# Patient Record
Sex: Female | Born: 1995 | Race: Black or African American | Hispanic: No | Marital: Single | Smoking: Never smoker
Health system: Southern US, Community
[De-identification: ages and names within clinical notes are randomized; demographics above are authoritative.]

## PROBLEM LIST (undated history)

## (undated) DIAGNOSIS — K5904 Chronic idiopathic constipation: Secondary | ICD-10-CM

## (undated) DIAGNOSIS — K219 Gastro-esophageal reflux disease without esophagitis: Secondary | ICD-10-CM

## (undated) DIAGNOSIS — R7989 Other specified abnormal findings of blood chemistry: Secondary | ICD-10-CM

## (undated) DIAGNOSIS — K589 Irritable bowel syndrome without diarrhea: Secondary | ICD-10-CM

## (undated) HISTORY — DX: Gastro-esophageal reflux disease without esophagitis: K21.9

## (undated) HISTORY — DX: Other specified abnormal findings of blood chemistry: R79.89

## (undated) HISTORY — DX: Chronic idiopathic constipation: K59.04

## (undated) HISTORY — DX: Irritable bowel syndrome, unspecified: K58.9

## (undated) HISTORY — PX: TONSILLECTOMY: SHX28A

## (undated) HISTORY — PX: TONSILLECTOMY: SUR1361

---
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
## 2015-01-03 ENCOUNTER — Encounter: Payer: Self-pay | Admitting: Primary Care

## 2015-01-03 DIAGNOSIS — K589 Irritable bowel syndrome without diarrhea: Secondary | ICD-10-CM | POA: Insufficient documentation

## 2015-01-03 NOTE — Progress Notes (Signed)
Heather Callahan is a 19 y.o. female seen to establish care.     Chief Complaint   Patient presents with    New Patient Visit     college     Transferring care from pediatrician. Just completed first year of college with predictable weight gain. No other concerns.     The following fields were reviewed and updated in EMR:     Patient Active Problem List    Diagnosis Date Noted    Scoliosis 12/28/2014     Intermittent back pain with stressful activities.  Playing euphonium in HS triggered back pain.       Anemia 12/28/2014     Resolved 2014 now resolved. No longer on iron.       BMI 40.0-44.9, adult 12/28/2014     Weight gain with college now coming off again.  Always overweight.  Active bowler.  Highest score 230.       Maternal hypothyroidism 12/28/2014    History of elevated lipids 12/28/2014     10/30/12 167/58/41/126      Impacted tooth 07/26/2011     Seen by dentist. Extraction not recommended.          Past Medical History   Diagnosis Date    Functional constipation     Low TSH level     Tinea pedis     Vitamin D deficiency        Current Outpatient Prescriptions   Medication Sig Dispense Refill    polyethylene glycol (GLYCOLAX,MIRALAX) powder packet 17 g daily as needed       No current facility-administered medications for this visit.        Past Surgical History   Procedure Laterality Date    Tonsillectomy         family history includes Breast cancer in her paternal grandmother; Thyroid disease in her mother.     reports that she has never smoked. She does not have any smokeless tobacco history on file. She reports that she does not drink alcohol, use illicit drugs, or engage in sexual activity.  Social History     Social History Narrative    Lives with paternal grandmother since 12 months of age after parents divorced.  Good relationships with family. Four full siblings and seven half siblings. 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                 Review of Systems    Constitutional: Negative.    HENT: Negative.    Eyes: Negative.    Respiratory: Negative.    Cardiovascular: Negative.    Gastrointestinal: Positive for constipation.   Genitourinary: Negative.    Musculoskeletal: Negative.    Skin: Negative.    Neurological: Negative.    Endo/Heme/Allergies: Negative.    Psychiatric/Behavioral: Negative.      Episodic constipation alternating with loose stools.  Mild abdominal pain relieved with stooling.  Long standing issue.     Medications and history reviewed with patient in erecord today. Revisions, if any, noted below.     Vitals:    12/28/14 1418   BP: 110/72   Pulse: 90   Weight: 108.2 kg (238 lb 9.6 oz)   Height: 1.626 m ( )     Body mass index is 40.96 kg/(m^2).    General:  Patient in no apparent distress.  Pleasant and cooperative.   HEENT:  Atraumatic.  PERRLA. EOMI. Oropharynx mucosa pink, moist and free of lesions.  TM bilaterally  visualized with good light reflex.  Thyroid without mass or enlargement.  Neck supple.  Nodes cervical and supraclavicular wnl. Sinuses Non-tender to light percussion.  Breast:  Normal appearance.  No masses appreciated on palpation.  No nipple discharge. Instructed breast self-exam.  CV:  Regular rate and rhythm, no murmur, gallop or rub.  No LE edema.   Lung:  Clear to auscultation bilaterally. No adventitial sounds.  No dullness to percussion. No use of accessory muscles to breath.  Abdomen: Normal appearance. Nontender. Normoactive bowel sounds. No organomegaly appreciated. No guarding rebound.   MSK/EXT:  5/5 bilateral UE/LE proximal and distal strength.   BACK:  Normal appearance. Intact LS flexion. No pain on palpation paraspinal muscles.    NEURO: CN ii-xii intact.  Sensory intact. Heel to toe walking, F->N, H->S intact. Romberg normal.  DTR 2+ bilateral knee and ankle. Gait, heel walk, toe walk, squat intact. SLR negative.   Psych:  Mood: Calm. Affect: appropriate to content of speech Thought: Logical. No suicidal ideation or  wish to harm others. Speech: Fluent and articulate. Insight: good.  GU: Declined exam.   Rectum:  Declined exam.   Nodes:  Axillary and Inguinal wnl.   Pulse: Carotid, radial, DP, PT wnl.   Skin: No lesions.     Labs: Reviewed from old records and significant abnormal labs noted in problem list.     Assessment/Plan:    1. Irritable bowel syndrome:  Long standing symptoms and likely dx.  Miralax for use prn.      2. Scoliosis: Not appreciated on exam. No back pain.     3. Anemia: Menstruating.  Repeat lab. NB: with GI symptoms that celiac may need to r/u.   - CBC and differential; Future    4. BMI 40.0-44.9, adult    - Hemoglobin A1c; Future  - TSH; Future  - T4, free; Future    5. Maternal hypothyroidism, unspecified trimester/ Note on late entry old records show prior low TSH.  Repeat labs ordered.     6. History of elevated lipids  Last labs wnl.  Recheck 2019.     7. Impacted tooth  Asymptomatic.         Health Maintenance   Topic Date Due    IMM-TDAP/TD 737-18 YRS (1) Records show given 2008.  Please revise.     HIV TESTING OFFERED  Declined.     IMM-INFLUENZA (1) 03/04/2015    IMM-HPV 9-18 YRS  Completed    IMM-MCV4 0-18 YRS  Completed       Return if symptoms worsen or fail to improve.    New Prescriptions    No medications on file     Modified Medications    No medications on file     Discontinued Medications    No medications on file        Current Outpatient Prescriptions   Medication Sig Dispense Refill    polyethylene glycol (GLYCOLAX,MIRALAX) powder packet 17 g daily as needed       No current facility-administered medications for this visit.

## 2015-01-05 ENCOUNTER — Encounter: Payer: Self-pay | Admitting: Primary Care

## 2015-01-06 ENCOUNTER — Encounter: Payer: Self-pay | Admitting: Primary Care

## 2015-07-07 ENCOUNTER — Encounter: Payer: Self-pay | Admitting: Gastroenterology

## 2015-07-14 ENCOUNTER — Encounter: Payer: Self-pay | Admitting: Primary Care

## 2015-07-14 DIAGNOSIS — L700 Acne vulgaris: Secondary | ICD-10-CM | POA: Insufficient documentation

## 2015-07-19 ENCOUNTER — Ambulatory Visit: Payer: Self-pay

## 2015-08-17 ENCOUNTER — Telehealth: Payer: Self-pay | Admitting: Primary Care

## 2015-08-17 NOTE — Telephone Encounter (Signed)
Spoke with patient concerning SOB and heart palpitations. Patient c/o of heart palpitations and SOB patient denies chest pressure, lightheaded,pain or nausea. Patient would like to be seen tomorrow. Encouraged patient if symptoms worsen to go to ED or Urgent Care patient verbalized understanding

## 2015-08-17 NOTE — Telephone Encounter (Signed)
08/18/15 with Dr.jaffe.

## 2015-08-18 ENCOUNTER — Ambulatory Visit: Payer: Self-pay | Admitting: Primary Care

## 2015-08-18 ENCOUNTER — Encounter: Payer: Self-pay | Admitting: Emergency Medicine

## 2015-08-18 ENCOUNTER — Emergency Department
Admission: EM | Admit: 2015-08-18 | Disposition: A | Discharge status: Home / Self Care | Payer: Self-pay | Source: Ambulatory Visit | Attending: Emergency Medicine | Admitting: Emergency Medicine

## 2015-08-18 ENCOUNTER — Other Ambulatory Visit: Payer: Self-pay | Admitting: Gastroenterology

## 2015-08-18 ENCOUNTER — Encounter: Payer: Self-pay | Admitting: Primary Care

## 2015-08-18 VITALS — BP 118/70 | HR 108 | Ht 64.0 in | Wt 250.0 lb

## 2015-08-18 DIAGNOSIS — R0602 Shortness of breath: Secondary | ICD-10-CM

## 2015-08-18 LAB — BASIC METABOLIC PANEL
Anion Gap: 13 (ref 7–16)
CO2: 23 mmol/L (ref 20–28)
Calcium: 8.7 mg/dL — ABNORMAL LOW (ref 9.0–10.4)
Chloride: 104 mmol/L (ref 96–108)
Creatinine: 0.78 mg/dL (ref 0.50–1.00)
GFR,Black: 127 *
GFR,Caucasian: 110 *
Glucose: 97 mg/dL (ref 60–99)
Lab: 7 mg/dL (ref 6–20)
Potassium: 4.4 mmol/L (ref 3.3–5.1)
Sodium: 140 mmol/L (ref 133–145)

## 2015-08-18 LAB — CBC AND DIFFERENTIAL
Baso # K/uL: 0 10*3/uL (ref 0.0–0.1)
Basophil %: 0.1 %
Eos # K/uL: 0 10*3/uL (ref 0.0–0.4)
Eosinophil %: 0.6 %
Hematocrit: 19 % — CL (ref 34–45)
Hemoglobin: 5.7 g/dL — ABNORMAL LOW (ref 11.2–15.7)
IMM Granulocytes #: 0 10*3/uL (ref 0.0–0.1)
IMM Granulocytes: 0.4 %
Lymph # K/uL: 2.4 10*3/uL (ref 1.2–3.7)
Lymphocyte %: 34.8 %
MCH: 23 pg — ABNORMAL LOW (ref 26–32)
MCHC: 29 g/dL — ABNORMAL LOW (ref 32–36)
MCV: 78 fL — ABNORMAL LOW (ref 79–95)
Mono # K/uL: 0.7 10*3/uL (ref 0.2–0.9)
Monocyte %: 9.9 %
Neut # K/uL: 3.7 10*3/uL (ref 1.6–6.1)
Nucl RBC # K/uL: 0 10*3/uL (ref 0.0–0.0)
Nucl RBC %: 0 /100 WBC (ref 0.0–0.2)
Platelets: 324 10*3/uL (ref 160–370)
RBC: 2.5 MIL/uL — ABNORMAL LOW (ref 3.9–5.2)
RDW: 16.1 % — ABNORMAL HIGH (ref 11.7–14.4)
Seg Neut %: 54.2 %
WBC: 6.9 10*3/uL (ref 4.0–10.0)

## 2015-08-18 LAB — TIBC
Iron: 17 ug/dL — ABNORMAL LOW (ref 34–165)
TIBC: 356 ug/dL (ref 250–450)
Transferrin Saturation: 5 % — ABNORMAL LOW (ref 15–50)

## 2015-08-18 LAB — POCT URINE PREGNANCY
Exp date: 3
Lot #: 119119

## 2015-08-18 LAB — HM HIV SCREENING OFFERED

## 2015-08-18 LAB — FERRITIN: Ferritin: 7 ng/mL — ABNORMAL LOW (ref 10–120)

## 2015-08-18 LAB — TYPE AND SCREEN
ABO RH Blood Type: O POS
Antibody Screen: NEGATIVE

## 2015-08-18 LAB — HOLD BLUE

## 2015-08-18 MED ORDER — SODIUM CHLORIDE 0.9 % IV SOLN WRAPPED *I*
3.0000 mL/h | Status: DC
Start: 2015-08-18 — End: 2015-08-19

## 2015-08-18 MED ORDER — SODIUM CHLORIDE 0.9 % IV BOLUS *I*
1000.0000 mL | Freq: Once | Status: AC
Start: 2015-08-18 — End: 2015-08-18
  Administered 2015-08-18: 1000 mL via INTRAVENOUS

## 2015-08-18 NOTE — ED Provider Notes (Addendum)
History     Chief Complaint   Patient presents with    Hypertension     She reports that sometimes, there is a throbbing in her head as though she can feel her pulse in her head.     Shortness of Breath     Pt stated that she feels as though any time she walks or anything, she feels SOB. Denies cardiac issues. Pt noted to be HTN @ triage, 172/72.      HPI Comments: Heather Callahan is a 20 y.o. female who presents with shortness of breath.  Triggered by activity and exertion.  Ongoing for the last 4 days.  Intermittent episodes.  Associated with the sensation of a racing heart.    History reviewed. No pertinent past medical history.     Past Surgical History:   Procedure Laterality Date    TONSILLECTOMY       History reviewed. No pertinent family history.    Social History    reports that she has never smoked. She does not have any smokeless tobacco history on file. Her alcohol, drug, and sexual activity histories are not on file.    Living Situation     Questions Responses    Patient lives with     Homeless No    Caregiver for other family member     External Services     Employment     Domestic Violence Risk           Problem List   There is no problem list on file for this patient.      Review of Systems   Review of Systems   Constitutional: Negative for fever.   HENT: Negative for sore throat.    Eyes: Negative for visual disturbance.   Respiratory: Positive for shortness of breath. Negative for cough.    Cardiovascular: Positive for palpitations. Negative for chest pain.   Gastrointestinal: Negative for abdominal pain, nausea and vomiting.   Genitourinary: Negative for decreased urine volume and dysuria.   Musculoskeletal: Negative for gait problem.   Skin: Negative for rash.   Neurological: Negative for syncope, light-headedness and headaches.   Psychiatric/Behavioral: Negative for confusion.       Physical Exam     ED Triage Vitals   BP Pulse Heart Rate (via Pulse Ox) Resp Temp Temp src SpO2 O2  Device O2 Flow Rate   08/18/15 1732 -- 08/18/15 1732 08/18/15 1732 08/18/15 1732 08/18/15 1732 08/18/15 1732 08/18/15 1732 --   172/72  116 18 37 C (98.6 F) TEMPORAL 100 % None (Room air)       Weight           08/18/15 1732           113.4 kg (250 lb)                    Physical Exam   Constitutional: She is oriented to person, place, and time. She appears well-developed. No distress.   Sitting up in bedside chair, alert, comfortable, calm.   HENT:   Head: Normocephalic and atraumatic.   Right Ear: External ear normal.   Left Ear: External ear normal.   Nose: Nose normal.   Eyes: Conjunctivae are normal. Right eye exhibits no discharge. Left eye exhibits no discharge. No scleral icterus.   Neck: Normal range of motion. Neck supple. No JVD present.   Cardiovascular: Regular rhythm, normal heart sounds and intact distal pulses.  Tachycardia present.  Pulmonary/Chest: Effort normal and breath sounds normal. No respiratory distress. She has no wheezes. She has no rales.   Abdominal: She exhibits no distension.   Musculoskeletal: Normal range of motion. She exhibits no edema.   Neurological: She is alert and oriented to person, place, and time. She exhibits normal muscle tone.   No LE edema or calf tenderness. Denna Haggard' sign negative bilaterally.     Skin: Skin is warm and dry. No rash noted. She is not diaphoretic.   Psychiatric: She has a normal mood and affect. Her behavior is normal. Thought content normal.   Nursing note and vitals reviewed.      Medical Decision Making      Amount and/or Complexity of Data Reviewed  Clinical lab tests: ordered and reviewed  Tests in the radiology section of CPT: ordered and reviewed  Decide to obtain previous medical records or to obtain history from someone other than the patient: yes  Review and summarize past medical records: yes  Discuss the patient with other providers: yes  Independent visualization of images, tracings, or specimens: yes        Initial Evaluation:  ED  First Provider Contact     Date/Time Event User Comments    08/18/15 1741 ED Provider First Contact Tyriq Moragne Initial Face to Face Provider Contact          Patient seen by me as above    Assessment:  19 y.o.female comes to the ED with exertional dyspnea and palpitations. She is taking OCPs. She is tachycardic. These are signs that raise some suspicion for a possible PE. Will obtain CTA chest to evaluate.     This is not consistent with a PE. Pt has no chest pain, no hemoptysis, no h/o PE or DVT, no h/o cancer, no recent surgeries or hospitalizations, no recent flights or long trips. Pt has no dyspnea, no tachypnea, no hypoxia, no hypotension, no leg swelling or pain.     ==============   Update:     Labs reveal significant anemia, which is the most likely etiology for her exertional symptoms. Pt has signed written consent for blood transfusion. Two units ordered.     ==============      Differential Diagnosis includes cardiac dysrhythmia; electrolyte abnormality, anemia, dehydration; pregnancy.     Plan:  1. Diagnostics: ekg, cbc, bmp, pregnancy   2. Therapeutic: IVFs    Salem Senate, MD         Salem Senate, MD  08/18/15 Nida Boatman       Salem Senate, MD  08/19/15 1012

## 2015-08-18 NOTE — ED Triage Notes (Signed)
Triage Note       [place] L Frederich Montilla, RN

## 2015-08-18 NOTE — Progress Notes (Signed)
Subjective: Heather Callahan is a 20 y.o. female has Impacted tooth; Scoliosis; Anemia; BMI 40.0-44.9, adult; Maternal hypothyroidism; History of elevated lipids; Irritable bowel syndrome; and Acne vulgaris on her problem list.  Patient is seen for acute visit.   Chief Complaint   Patient presents with    Shortness of Breath     Young woman recently started on oral contraception pill now with several days of shortness of breath with walking.  She notices that her heartbeat is very fast.  Initially she thought this might be because she had started to drink a large caffeinated drink in the mornings.  She stopped doing that for several days.  Symptoms have persisted.  No recent illness.  No recent travel.  No trauma.  No lower extremity edema.  No chest pain.    Medications and history reviewed with patient in erecord today. Revisions, if any, noted below.     PE:    Vitals:    08/18/15 1432   BP: 118/70   Pulse: 108   Weight: 113.4 kg (250 lb)   Height: 1.626 m ( )     Body mass index is 42.91 kg/(m^2).    General:  Patient in no apparent distress.  Pleasant and cooperative.   CV: LAD murmur at the base.  Not previously noted.  Lung:  Clear to auscultation bilaterally. No adventitial sounds.  No dullness to percussion. No use of accessory muscles to breath.  Abdomen: Central obesity unchanged.  Nontender. Normoactive bowel sounds. No organomegaly appreciated. No guarding rebound.       Labs:    Documentation Only on 01/06/2015   Component Date Value Ref Range Status    HM HIV SCREENING OFFERED 12/28/2014 Declined   Final       Assessment/Plan:    1. Shortness of breath:  Patient is directed to the ED because of new onset dyspnea on exertion, tachycardia, new murmur not previously appreciated.  Concern for PE, concern for new pericardial effusion.  She is given a note to excuse her from exam that she has this afternoon.  Her plan is to go directly to the [facility] ED as suggested.  Case reviewed with Dr. Lynelle Smoke  in ED and plan is the get CT Chest, labs and consider echo.     Return if symptoms worsen or fail to improve.     New Prescriptions    No medications on file     Modified Medications    No medications on file     Discontinued Medications    No medications on file        Current Outpatient Prescriptions   Medication Sig Dispense Refill    LORYNA 3-0.02 MG per tablet 1 BY MOUTH EVERY DAY  2    polyethylene glycol (GLYCOLAX,MIRALAX) powder packet 17 g daily as needed       No current facility-administered medications for this visit.

## 2015-08-19 LAB — TRANSFERRIN: Transferrin: 284 mg/dL (ref 200–360)

## 2015-08-19 NOTE — ED Provider Progress Notes (Signed)
ED Provider Progress Note    Critical Care    There is a high probability of imminent or life threatening deterioration due to  circulatory failure    This is secondary to acute anemia    Acute interventions include blood product transfusion, development of treatment plan with patient or surrogate, obtaining history from patient or surrogate, examination of patient, ordering and performing treatments and interventions, ordering and review of laboratory studies, ordering and review of radiographic studies, evaluation of the patient's response to treatment, review of old charts and discussion with primary provider    Exact time of critical care (exclusive of other billable procedures)  39 minutes          Salem Senate, MD, 08/19/2015, 4:37 PM         Salem Senate, MD  08/19/15 [phone]

## 2015-08-19 NOTE — Discharge Instructions (Signed)
You were given 2 units of blood. Your blood count will still be low. Work with your doctor to bring it up more.   See attached instructions regarding side effects of transfusion and what to watch for.  Please return to the ED with any problems.

## 2015-08-20 LAB — EKG 12-LEAD
P: 23 degrees
QRS: 43 degrees
Rate: 104 {beats}/min
Severity: BORDERLINE
Severity: BORDERLINE
Statement: BORDERLINE
Statement: NORMAL
T: -6 degrees

## 2015-08-20 LAB — RED BLOOD CELLS
Coded Blood type: 5100
Coded Blood type: 5100
Component blood type: O POS
Component blood type: O POS
Dispense status: TRANSFUSED
Dispense status: TRANSFUSED

## 2015-08-21 ENCOUNTER — Telehealth: Payer: Self-pay | Admitting: Primary Care

## 2015-08-21 NOTE — Telephone Encounter (Signed)
Patient was seen in emergency room for heavy menstrual bleeding, was given transfusions.  Her mother called today to see what supplement for iron would be appropriate.  Advised ferrous sulfate 325 mg once daily.  She will follow up with gynecology this upcoming week.

## 2015-11-04 ENCOUNTER — Ambulatory Visit: Payer: Self-pay | Admitting: Internal Medicine

## 2015-12-14 ENCOUNTER — Ambulatory Visit: Payer: Self-pay | Admitting: Internal Medicine

## 2015-12-14 ENCOUNTER — Encounter: Payer: Self-pay | Admitting: Internal Medicine

## 2015-12-14 VITALS — BP 112/72 | Temp 98.1°F | Ht 64.0 in | Wt 259.0 lb

## 2015-12-14 DIAGNOSIS — K047 Periapical abscess without sinus: Secondary | ICD-10-CM

## 2015-12-14 MED ORDER — PENICILLIN V POTASSIUM 500 MG PO TABS *I*
500.0000 mg | ORAL_TABLET | Freq: Four times a day (QID) | ORAL | 0 refills | Status: DC
Start: 2015-12-14 — End: 2018-09-30

## 2015-12-15 ENCOUNTER — Ambulatory Visit: Payer: Self-pay | Admitting: Oral and Maxillofacial Surgery

## 2015-12-15 NOTE — Progress Notes (Signed)
HPI Heather Callahan is a 20 year old patient with history of irritable bowel syndrome, scoliosis, anemia, who presents today with a three-day history of pain left maxillary area and upper jaw.  She does have a history of impacted wisdom tooth, and has noted foul taste in her mouth associated with that.    ROS HEENT patient does have maxillary sinus tenderness, left jaw pain, and heaviness around her eye.  She denies any photophobia or eye pain.  Respiratory she denies any hoarseness or cough, cardiovascular patient denies any palpitations, syncope or shortness of breath.    Physical exam BP is 112/72, temperature is 36.7, pain is 4 out of 10 in her left upper jaw.  HEENT PERRLA, EOMs are intact, conjunctiva are clear.  Ears and nose are clear, throat is clear, she does have mild erythema and tenderness behind last upper molar on the left.  Tender to palpation.  She does have some maxillary sinus tenderness on exam on the left.  She does have anterior cervical lymphadenopathy on the left, lungs are clear throughout.  Heart revealed normal S1 and S2 without murmur, gallop or rub.    Assessment and plan abscess wisdom tooth will begin Pen-Vee K 500 mg 4 times daily for 10 days, gargle with salt water and especially after meals, follow-up with dentist as needed.

## 2015-12-16 ENCOUNTER — Ambulatory Visit: Payer: Self-pay | Admitting: Oral and Maxillofacial Surgery

## 2015-12-16 ENCOUNTER — Encounter: Payer: Self-pay | Admitting: Oral and Maxillofacial Surgery

## 2015-12-16 VITALS — BP 137/86 | HR 79 | Resp 17 | Ht 65.0 in | Wt 258.0 lb

## 2015-12-16 DIAGNOSIS — K011 Impacted teeth: Secondary | ICD-10-CM

## 2015-12-16 NOTE — H&P (Signed)
Oral and Maxillofacial Surgery  Consult Note      Chief Complaint   Patient presents with    Dental Problem     third molars       Past Medical History:   Diagnosis Date    Functional constipation     GERD (gastroesophageal reflux disease)     IBS (irritable bowel syndrome)     Low TSH level     Tinea pedis     Vitamin D deficiency        Past Surgical History:   Procedure Laterality Date    TONSILLECTOMY      TONSILLECTOMY         Prior to Admission medications    Medication Sig Start Date End Date Taking? Authorizing Provider   penicillin v potassium (VEETIDS) 500 MG tablet Take 1 tablet (500 mg total) by mouth 4 times daily 12/14/15   Caprice RenshawHall, Catherine A, PA   LORYNA 3-0.02 MG per tablet 1 BY MOUTH EVERY DAY 08/07/15   [provider]   polyethylene glycol (GLYCOLAX,MIRALAX) powder packet 17 g daily as needed    [provider]          Current Outpatient Prescriptions   Medication    penicillin v potassium (VEETIDS) 500 MG tablet    LORYNA 3-0.02 MG per tablet    polyethylene glycol (GLYCOLAX,MIRALAX) powder packet     No current facility-administered medications for this visit.           Allergies:  Review of patient's allergies indicates no known allergies (drug, envir, food or latex).    Social:  reports that she has never smoked. She has never used smokeless tobacco. She reports that she does not drink alcohol or use illicit drugs.     Family Hx:  Family History   Problem Relation Age of Onset    Thyroid disease Mother     Breast cancer Paternal Grandmother        VS: Blood pressure 137/86, pulse 79, resp. rate 17, height 1.651 m (5\' 5" ), weight 117 kg (258 lb), last menstrual period 10/05/2015, SpO2 99 %.    [facility]    Problem/Concerns: Impacted/malpositioned teeth # [id number],16,17,32    Subjective:  20yo F w/h/o obesity and PCOS presents for evaluation of her third molars for extraction. She reports isolated pain to tooth #16 a few days ago that was causing a  HA. Her HA was so severe that she vomited. Otherwise no h/o facial edema.    Pt reports 0/10 pain    Pt denies any fever, chills, or other s/s of systemic infection    Objective:              General:  AAOx3                   ASA: 2    Extraoral Exam: No LAD, No TTP, TMJ WNL, no facial asymmetries noted.     Intraoral Exam: FOM soft and nonelevated, OP clear and uvula at midline, good OH,   MIO 50mm. No edema, erythema, purulence, or other signs of acute infection present. Teeth # 1,16 are partially erupted into the oral cavity. Teeth # 17,32 are fully into the oral cavity at this time. Tooth #32 is unopposed and non-functional.    Mallampati: 1  Thyromental distance: 4 fw    Images: Current Radiographs in [facility] obtained 6/13  Findings:     TMJ: No obvious pathology, No hard  tissue pathology           Teeth position / impaction: Teeth #1,16 fully developed. Teeth #17,32 fully developed and erupted. Root darkening tooth #32.      Assessment: 20 y.o. female w/h/o obesity, PCOS presents for evaluation of teeth # [id number] for extraction. Medical hx reviewed with pt and updated in chart. Teeth # [id number] are malpositioned/impacted and may pose problems to other teeth and overall periodontal health, and are indicated for extraction. D/t dental anxiety, pt would prefer to have procedure completed under IV sedation.    At least 30 mins of time spent with patient during this visit    Case discussed with attending covering the OMFS clinic on this date    Informed Consent:   I fully discussed the rationale, technical details, risks/benefits, and alternatives of surgery and anesthesia including no treatment as an option.    Preoperative and postoperative expectations were reviewed.   I reviewed with patient the risks and alternatives of Local, N2O, IV Sedation, and General Anesthesia.  I fully discussed the following potential complications: Pain, Bleeding, Swelling, Bruising, Infection, Numbness, and Damage  to Adjacent Structures as well as attempts to minimize these risks.  Questions were encouraged and answered to the patient's apparent satisfaction.   The patient was given preoperative information.    Surgical Plan:   1) Extraction of teeth # 1,61,09,60  2) Anesthesia: IV  3) Time required: 60 mins  4) Resident level: R2-4     All questions answered and pt left clinic in good condition.    This case was discussed with Dr. Fredderick Erb, DMD  OMFS Resident

## 2015-12-16 NOTE — Patient Instructions (Signed)
ORAL & MAXILLOFACIAL SURGERY    Pre-procedure Instructions for Intravenous (IV) Sedation and   information about wisdom tooth extraction    IV sedation is administered to relax you and to decrease any pain you might have.  It is always used with and injection of local anesthesia or “novocaine” at the site of the surgery.  You will not be unconscious.  You will feel drowsy, you may even sleep.  You may or may not remember the procedure.    It is IMPORTANT to follow these instructions so that your procedure can be done properly and safely.  Failure to follow these instructions may lead to cancellation and your appointment will need to be re-scheduled.    1. DO NOT EAT OR DRINK ANYTHING AFTER MIDNIGHT the night before the procedure, including water.  If you take prescription medication(s) for a heart condition, high blood pressure, infection or other illness, take the medication as scheduled with a small sip of water.    2. YOU MUST BE ACCOMPANIED BY A RESPONSIBLE ADULT WHO WILL HELP YOU GET HOME.  This person must wait in the waiting area during the entire time of the procedure.  You will not be able to leave the clinic alone, and you are not allowed to drive or operate heavy machinery for 24 hours after your sedation.  If you fail to have an escort with you in the waiting room at the scheduled time of your procedure, your appointment will be cancelled.      3. Wear a loose fitting short sleeve shirt.  Do not wear make-up, earrings, tongue or lip rings, nail polish or loose fitting shoes like flip-flops.    4. Persons under 18 years old must be accompanied by a parent or legal guardian.    If you are more than 15 minutes late or your ride is not physically with you when you arrive, your appointment will be cancelled (at discretion of the provider).    Every effort will be made to ensure that your surgery will begin at the scheduled time; however, there are occasions when a procedure may take longer than anticipated  and your procedure may be delayed.    WE WILL CANCEL YOUR SURGERY IF:  1. YOU HAD ANYTHING TO EAT OR DRINK SINCE MIDNIGHT  2. YOU HAVE NOT TAKEN MEDICATIONS AS DIRECTED  3. YOU HAVE NO RESPONSIBLE ADULT WHO CAN STAY WITH YOU AND TAKE YOU HOME  4. YOU ARRIVE MORE THAN 15 MINUTES LATE (at the discretion of the provider)        Wisdom teeth extraction  Wisdom teeth, or third molars, do not always erupt properly when they decide to make an appearance. It's wise to get an early opinion from your dentist on getting wisdom teeth pulled before they become impacted, causing pain, swelling, infection, cavities or gum disease.    Why don't wisdom teeth grow in right??The shape of the modern human mouth is often too small to accommodate wisdom teeth which make their first appearance in young adults between the ages of 15 to 25. Over the course of time, humans learned to harness fire for cooking foods and developed blade tools to better process food before consumption, they reduced the need for strong jaws to chew food.  According to studies of ancient skull specimens, over time, a full set of teeth in a smaller jaw caused crowding in permanent teeth because of lack of space.    What does impacted mean?? When wisdom teeth   don't have room to grow or they haven't reached their final position by age 25, they are considered impacted-no place to go and no plans to grow. Third molar impaction is the most prevalent medical developmental disorder. A full set of healthy teeth sometimes doesn't leave much room for wisdom teeth to erupt.    What kind of problems can impacted third molars cause?? Partially erupted wisdom teeth are breeding grounds for bacteria and germs that may cause infection, and cysts and tumors may grow on a trapped wisdom tooth. Jaw pain and gum disease may occur. Not all wisdom teeth cause problems, however.    What if I don't have any symptoms? People with symptoms of impaction, such as pain, swelling and infection  should have their wisdom teeth removed immediately. However, those with no symptoms can avoid the chance of ever suffering from the pain of impacted wisdom teeth or achieve better orthodontic treatment results by having them removed. Asymptomatic impacted wisdom teeth also should be removed to reduce the chance of unexplained pain, accommodate prosthetic appliances, or avoid cavities, periodontal disease, bone shrinkage and tumor development.    Why can't I just use an antibiotic?? Antibiotics only soothe infected wisdom teeth for a short time. Since people frequently use a wide variety of antibiotics, the infection may be resistant to such medication and doesn't solve the real problem: The tooth can't fit in your mouth.    When is removal necessary?? It isn't wise to wait until wisdom teeth bother you. Early removal, as advised by your dentist, is generally recommended to avoid problems, such as an impacted tooth that destroys the second molar. People younger than 16 heal easier too. At an early age, people should be evaluated by their dentist who can track third molar development with the help of X-rays. Second molars should be visible to lessen the chance of damaging them during surgery. This occurs at age 11 or 12, so wisdom teeth should be removed when the decision has been made that they cannot erupt into an acceptable position.    How is the tooth removed?? Surgery for impacted wisdom teeth consists of removing of the gum tissue over the tooth, gently stripping connective tissue away from the tooth and bone, removing the tooth and sewing the gum back up.    Recommended Website:  www.[facility]org            ORAL & MAXILLOFACIAL SURGERY    POSTOPERATIVE INSTRUCTIONS FOLLOWING DENTAL SURGERY    DO NOT drive, operate heavy machinery, drink alcoholic beverages, make major decisions, or sign important documents while taking narcotic pain medications.     1.  First 30 minutes after procedure:    Bite on gauze for 30  minutes after your procedure, to help stop bleeding.  It is best that you do this without checking the site until the 30 minutes is up. Do not chew on the gauze, as continuous pressure is preferred.  During this time, do not speak or do other activities that could dislodge the gauze packs.  If bleeding continues after 30 minutes, bite on fresh gauze as follows:  dampen the gauze first so that it is moist (not wet) and fold it twice.  Place the dampened gauze on the extraction sites for another 30 minutes.  Repeat as necessary.  A small amount of blood, which tinges the saliva red, is expected during the first 24 hours and does not require further pressure.  If bleeding restarts after it has stopped, use   gauze pressure again or consider gently biting on a dry tea bag placed over the extraction site for 30 minutes.  If the bleeding does not stop despite repeated attempts as above, please call [phone].      2.  Avoid excessive exertion for 72 hours after your procedure.  No bending, lifting, strenuous activity,sports or gym-class.  After this time, resume normal activity gradually as you become comfortable.      3.  Mouth opening may be limited during the first week to ten days after surgery.  This is expected due to swelling and tightness of the jaw muscles.     4.  Bruising (black and blue areas) may develop after surgery.  Should this occur, expect bruising to resolve after the first week to ten days.     5.   Earache or TMJ (jaw joint) tenderness may develop that will gradually resolve over the first week.     6.   NO ORAL CARE during the first 24 hours.  Beginning on the day after surgery, rinse your mouth after meals (or 4-5 times per day) with warm salt water (1/2 teaspoon of salt in an 8oz. glass of warm water).  A prescription mouth rinse may be given in selected cases. Begin tooth brushing on the day after surgery, as you normally would however this should be performed gently in the area of extractions.      7.   DO NOT USE STRAWS for the first week after surgery.  Straws create suction pressure inside your mouth and could dislodge the blood clots that normally form in the extraction sites.      8.   DO NOT SMOKE during the first 21 days after surgery.  Smoking greatly increases the risk of  and other healing complications including infection, pain, and dry socket.            9.   SWELLING is normal and will maximize 48-72 hours after surgery.  Apply ice in a damp towel against your cheeks for the first 24-48 hours, as follows: 20 minutes on 40 minutes off, then repeat. Keep your head elevated with 2 or 3 pillows when lying down during the first 24 hours.  This will reduce bleeding, swelling and improve your level of comfort.  May apply VaselineR or ChapstickR to the lips to keep them moist.      10.    Mild to moderate pain may be encountered after the local anesthetic wears off. If allowed by your health history, take two Extra-Strength TylenolR (500mg ea.) or two MotrinR/AdvilR/Ibuprofen (200mg ea).  Avoid aspirin-containing products since these may lead to more bleeding.  For moderate to severe pain, take the prescription medication as prescribed for you.  Do not operate heavy machinery or make important decisions while on narcotics.          11.  In case of uncontrolled bleeding, extreme pain or unusual circumstances, please call the office.     POSTOPERATIVE DIET SUGGESTIONS    1.  Avoid eating or drinking anything within the first hour after surgery.  After this time, drink lots of liquids and eat soft, cool foods.  Avoid excessively hot liquids and foods for the first 24 hours.             2.  During the first week, avoid hard, crispy foods such as chips, hard pretzels, and popcorn.            3.   The following   food choices are recommended (“liquid foods on the day of the procedure, soft “mushy” foods during the first week advancing to regular food as tolerated”):      a. During the first few days,  blenderized foods, soup broth, well-cooked pasta, eggs, yogurt, cottage cheese, Jell-O, pudding, custard, applesauce, bananas and ice cream.       b. After the first few days, gradually increase your diet to include: fish, chicken, meatloaf, mashed potatoes, cooked vegetables, soups and cereals, etc…     SINUS PRECAUTIONS (if indicated)  1.  Keep your head elevated during sleep with two to three pillows.  2.  Do NOT blow your nose!  3.  If you sneeze do not sneeze through the nose and do not “hold back.”  You may sneeze with your mouth open.    4.  Expect slight nose bleeds.  Call the office for heavy bleeding from the nose that you are unable to control with pressure.   5.  Do NOT do anything that requires a sucking-in or blowing-out motion such as smoking, use of straws or blowing up balloons.   6.  Take all prescribed and/or recommended medications as directed.

## 2015-12-23 ENCOUNTER — Telehealth: Payer: Self-pay

## 2015-12-23 NOTE — Telephone Encounter (Signed)
Message left for patient stating appointment time and reminder.  Instructions of nothing to eat or drink 8 hours prior to sedation given and need for responsible adult to bring pt to clinic, wait during procedure and drive home.   Instructions also given to arrive 30 minutes prior to appointment time for procedure preparation and that if pt is 15 minutes late, procedure may be cancelled. Clinic number left for concerns or questions.

## 2015-12-24 ENCOUNTER — Ambulatory Visit: Payer: Self-pay | Admitting: Oral and Maxillofacial Surgery

## 2016-02-11 ENCOUNTER — Encounter: Payer: Self-pay | Admitting: Oral and Maxillofacial Surgery

## 2016-02-11 ENCOUNTER — Ambulatory Visit: Payer: Self-pay | Admitting: Oral and Maxillofacial Surgery

## 2016-02-11 VITALS — BP 119/75 | HR 90 | Resp 18 | Ht 65.0 in | Wt 256.0 lb

## 2016-02-11 DIAGNOSIS — K011 Impacted teeth: Secondary | ICD-10-CM

## 2016-02-11 MED ORDER — MIDAZOLAM HCL 5 MG/5ML IJ SOLN *I*
INTRAMUSCULAR | Status: AC
Start: 2016-02-11 — End: 2016-02-11
  Administered 2016-02-11: 5 mg via INTRAVENOUS
  Filled 2016-02-11: qty 5

## 2016-02-11 MED ORDER — FENTANYL CITRATE 50 MCG/ML IJ SOLN *WRAPPED*
INTRAMUSCULAR | Status: AC
Start: 2016-02-11 — End: 2016-02-11
  Administered 2016-02-11: 100 ug via INTRAVENOUS
  Filled 2016-02-11: qty 2

## 2016-02-11 MED ORDER — DEXAMETHASONE SODIUM PHOSPHATE 4 MG/ML INJ SOLN *WRAPPED*
8.0000 mg | Freq: Once | INTRAMUSCULAR | Status: AC
Start: 2016-02-11 — End: 2016-02-11

## 2016-02-11 MED ORDER — DEXAMETHASONE SODIUM PHOSPHATE 4 MG/ML INJ SOLN *WRAPPED*
INTRAMUSCULAR | Status: AC
Start: 2016-02-11 — End: 2016-02-11
  Administered 2016-02-11: 8 mg via INTRAVENOUS
  Filled 2016-02-11: qty 2

## 2016-02-11 MED ORDER — PROPOFOL 10 MG/ML IV EMUL (INTERMITTENT DOSING) WRAPPED *I*
60.0000 mg | Freq: Once | INTRAVENOUS | Status: AC
Start: 2016-02-11 — End: 2016-02-11

## 2016-02-11 MED ORDER — KETAMINE HCL 10 MG/ML IJ/IV SOLN *WRAPPED*
Status: AC
Start: 2016-02-11 — End: 2016-02-11
  Administered 2016-02-11: 15 mg via INTRAVENOUS
  Filled 2016-02-11: qty 20

## 2016-02-11 MED ORDER — MIDAZOLAM HCL 5 MG/5ML IJ SOLN *I*
5.0000 mg | Freq: Once | INTRAMUSCULAR | Status: AC
Start: 2016-02-11 — End: 2016-02-11

## 2016-02-11 MED ORDER — IBUPROFEN 600 MG PO TABS *I*
600.0000 mg | ORAL_TABLET | Freq: Three times a day (TID) | ORAL | 0 refills | Status: AC | PRN
Start: 2016-02-11 — End: ?

## 2016-02-11 MED ORDER — SODIUM CHLORIDE 0.9 % IV SOLN WRAPPED *I*
125.0000 mL/h | Status: AC
Start: 2016-02-11 — End: 2016-04-11
  Administered 2016-02-11: 125 mL/h via INTRAVENOUS

## 2016-02-11 MED ORDER — KETAMINE HCL 10 MG/ML IJ/IV SOLN *WRAPPED*
15.0000 mg | Freq: Once | Status: AC
Start: 2016-02-11 — End: 2016-02-11

## 2016-02-11 MED ORDER — PROPOFOL 10 MG/ML IV EMUL (INTERMITTENT DOSING) WRAPPED *I*
INTRAVENOUS | Status: AC
Start: 2016-02-11 — End: 2016-02-11
  Administered 2016-02-11: 60 mg via INTRAVENOUS
  Filled 2016-02-11: qty 20

## 2016-02-11 MED ORDER — HYDROCODONE-ACETAMINOPHEN 5-325 MG PO TABS *I*
1.0000 | ORAL_TABLET | Freq: Four times a day (QID) | ORAL | 0 refills | Status: DC | PRN
Start: 2016-02-11 — End: 2018-09-30

## 2016-02-11 MED ORDER — FENTANYL CITRATE 50 MCG/ML IJ SOLN *WRAPPED*
100.0000 ug | Freq: Once | INTRAMUSCULAR | Status: AC
Start: 2016-02-11 — End: 2016-02-11

## 2016-02-11 NOTE — Preop H&P (Signed)
AMBULATORY  Chief Complaint:   Chief Complaint   Patient presents with    Procedure     Extraction teeth #1, 16, 17, 32 with IV sedation       History of Present Illness:  HPI Comments: 20yo female with h/o obesity, GERD, and PCOS presents for extraction of teeth #1, 16, 17, 32 with IV sedation      Past Medical History:   Diagnosis Date    Functional constipation     GERD (gastroesophageal reflux disease)     IBS (irritable bowel syndrome)     Low TSH level     Tinea pedis     Vitamin D deficiency      Past Surgical History:   Procedure Laterality Date    TONSILLECTOMY      TONSILLECTOMY       Family History   Problem Relation Age of Onset    Thyroid disease Mother     Breast cancer Paternal Grandmother      Social History     Social History    Marital status: Single     Spouse name: N/A    Number of children: N/A    Years of education: N/A     Social History Main Topics    Smoking status: Never Smoker    Smokeless tobacco: Never Used    Alcohol use No    Drug use: No    Sexual activity: No     Other Topics Concern    None     Social History Narrative    ** Merged History Encounter **         Lives with paternal grandmother since 262 months of age after parents divorced.  Good relationships with family. Four full siblings and seven half siblings. 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             Allergies:   No Known Allergies (drug, envir, food or latex)    Medications:  Current Outpatient Prescriptions   Medication    LORYNA 3-0.02 MG per tablet    polyethylene glycol (GLYCOLAX,MIRALAX) powder packet    HYDROcodone-acetaminophen (NORCO) 5-325 MG per tablet    ibuprofen (ADVIL,MOTRIN) 600 MG tablet    penicillin v potassium (VEETIDS) 500 MG tablet     Current Facility-Administered Medications   Medication Dose Route Frequency    sodium chloride 0.9 % IV  125 mL/hr Intravenous Continuous    midazolam (VERSED) 1 mg/mL injection        propofol (DIPRIVAN) 10 MG/ML injection         ketamine (KETALAR) 10 MG/ML injection 15 mg  15 mg Intravenous Once    midazolam (VERSED) injection 5 mg  5 mg Intravenous Once    propofol (DIPRIVAN) injection 60 mg  60 mg Intravenous Once        Review of Systems:   Review of Systems   Constitutional: Negative.  Negative for chills and fever.   HENT: Negative for hearing loss.         Impacted 3rd molars   Eyes: Negative.  Negative for blurred vision and double vision.   Respiratory: Negative.  Negative for shortness of breath and wheezing.    Cardiovascular: Negative.  Negative for chest pain and palpitations.   Gastrointestinal: Negative.  Negative for nausea and vomiting.   Genitourinary: Negative.    Musculoskeletal: Negative.    Skin: Negative.    Neurological: Negative.  Negative for seizures.   Endo/Heme/Allergies: Negative.  Does not bruise/bleed easily.   Psychiatric/Behavioral: Negative for depression. The patient is nervous/anxious.        Blood pressure 152/76, pulse 110, resp. rate 12, height 1.651 m ( ), weight 116.1 kg (256 lb), SpO2 100 %.    Physical Exam   Constitutional: She is oriented to person, place, and time. She appears well-developed and well-nourished.   HENT:   Head: Normocephalic and atraumatic.   Right Ear: External ear normal.   Left Ear: External ear normal.   Mouth/Throat: Oropharynx is clear and moist.   Impacted 3rd molars   Eyes: Pupils are equal, round, and reactive to light.   Neck: Normal range of motion.   Cardiovascular: Normal rate, regular rhythm and normal heart sounds.    No murmur heard.  Pulmonary/Chest: Effort normal and breath sounds normal. She has no wheezes.   Abdominal: Soft. Bowel sounds are normal.   Obese   Musculoskeletal: Normal range of motion.   Neurological: She is alert and oriented to person, place, and time.   Skin: Skin is warm and dry.   Psychiatric: She has a normal mood and affect. Her behavior is normal. Judgment and thought content normal.   Vitals reviewed.      Lab Results:  none    Radiology impressions (last 30 days):  No results found.    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 ASA: II, Mallampati: II, NPO: since midnight    Plan: Extraction teeth #1, 16, 17, 32 with IV sedation    Author: Olene Floss, DDS  Note created: 02/11/2016  at: 2:09 PM

## 2016-02-11 NOTE — Invasive Procedure Plan of Care (Signed)
Invasive Procedure Plan of Care (Consent Form 419):   Condition(s) Addressed: Impacted teeth, Dental Decay   Person Performing Procedure: Olene Floss, and OMFS Residents: Artis Delay, Samara Deist, Rush Barer, Audel Coakley, Irine Seal, Theophilus Kinds, Erlene Quan, Denese Killings Standing, Milly Jakob  OMFS Attendings: Rocky Crafts, Lenon Ahmadi, Glorianne Manchester, Trisha Mangle, Mellody Drown, Richard Randa Evens, Thom Chimes, Nicholas Schellati, Minette Headland     Side: Bilateral    Procedure: - Extraction of Tooth #'s 1, 16, 17, 32    Special Equipment:    Planned Anesthesia:    Benefits: Removal of source of pain and infection. Improved oral hygiene.   Risks: Pain, bleeding, swelling, infection, dry socket, bony spicule, damage to adjacent teeth or structures, retained or displaced tooth fragments, sinus communication, TMJ pain, trismus, nerve injury with temporary or permanent numbness to chin, lip and/or tongue with/without loss of some taste, mandibular and/or tuberosity fracture. Risks of anesthesia.     Alternatives: Continued observation. No treatment.   Expected Length of Stay: 0 day(s)   Pt Decision: Agrees to proceed     ----------------------------------------------------------------------------------------------------------------------------------------  Consent:  I hereby give my consent and authorize Olene Floss, and OMFS Residents: Artis Delay, Samara Deist, Rush Barer, Rozelle Caudle, Irine Seal, Theophilus Kinds, Erlene Quan, Denese Killings Standing, Milly Jakob  OMFS Attendings: Rocky Crafts, Lenon Ahmadi, Glorianne Manchester, Trisha Mangle, Mellody Drown, Richard Randa Evens, Thom Chimes, Nicholas Schellati, Minette Headland    (The list of possible assistants, all of whom are privileged to provide surgical services at the hospital, is available)  To treat the following: Impacted teeth, Dental Decay  Procedure includes: - Extraction of Tooth #'s 1, 16, 17, 32   1  The care provider has explained my condition to me, the benefits of having the above treatment procedure, and alternate ways of treating my condition. I understand that no guarantees have been made to me about the result of the treatment. The alternatives to this procedure include: Continued observation. No treatment.   2 The care provider has discussed with me the reasonably foreseeable risks of the treatment and that there may be undesirable results. The risks that are specifically related to this procedure include: Pain, bleeding, swelling, infection, dry socket, bony spicule, damage to adjacent teeth or structures, retained or displaced tooth fragments, sinus communication, TMJ pain, trismus, nerve injury with temporary or permanent numbness to chin, lip and/or tongue with/without loss of some taste, mandibular and/or tuberosity fracture. Risks of anesthesia.     3 I understand that during the treatment a condition may be discovered which was not known before the treatment started. Therefore, I authorize the care provider to perform any additional or different treatment which is thought necessary and available.   4 Any tissue, parts, or substances removed during the procedure may be retained or disposed of in accordance with customary scientific, educational and clinical practice.   5 Vendor information if appropriate:      6 Patient Consent for Medical or Surgical Procedure: I have carefully read and fully understand this informed consent form, and have had sufficient opportunity to discuss my condition and the above procedure(s) with the care provider and his/her associates, and all of my questions have been answered to my satisfaction.    Agrees to proceed       ____________________________________________________   _______________   Signature of Patient  Date/Time     ____________________________________________________   _______________   Signature of Parent or Legal Guardian  (if Patent is  unable to sign  or is a minor) Date/Time       Complete this section for all OR procedures and all other invasive internal procedures performed in any setting.  7 Consent for Receipt of Tissue(s): Not applicable   8 For those procedures that have the potential for significant blood loss: not applicable.   9 Patient Consent for Blood/Tissue: Not applicable.    Patient sign here unless 7 and 8 do not apply.       ____________________________________________________   _______________   Signature of Patient Date/Time     ____________________________________________________   _______________   Signature of Parent or Legal Guardian  (if Patent is unable to sign or is a minor) Date/Time      I have discussed the planned procedure, including the potential for any transfusion of blood products or receipt of tissue as necessary, expected benefits, the potential complications and risks and possible alternatives and their benefits and risks with the patient or the patient's surrogate. In my opinion, the patent or the patient's surrogate understands the proposed procedure, its risks, benefits, and alternatives.    Electronically signed by Olene Flossharles Arcadio Cope, DDS at 1:22 PM

## 2016-02-11 NOTE — Patient Instructions (Signed)
ORAL & MAXILLOFACIAL SURGERY    POSTOPERATIVE INSTRUCTIONS FOLLOWING DENTAL SURGERY AND SEDATION    After receiving sedative medications you may be drowsy for up to 24hrs.  DO NOT drive or operate heavy machinery for 24hrs, DO NOT drink alcoholic beverages for 24hrs, DO NOT make major decisions, sign contracts, etc. for 24hrs.    DO NOT drive, operate heavy machinery, drink alcoholic beverages or make major decisions or sign important documents while taking narcotic pain medications.     1.  First 30 minutes after procedure:    Bite on gauze for 30 minutes after your procedure, to help stop bleeding.  It is best that you do this without checking the site until the 30 minutes is up. Do not chew on the gauze, as continuous pressure is preferred.  During this time, do not speak or do other activities that could dislodge the gauze packs.  If bleeding continues after 30 minutes, bite on fresh gauze as follows:  dampen the gauze first so that it is moist (not wet) and fold it twice.  Place the dampened gauze on the extraction sites for another 30 minutes.  Repeat as necessary.  A small amount of blood, which tinges the saliva red, is expected during the first 24 hours and does not require further pressure.  If bleeding restarts after it has stopped, use gauze pressure again or consider gently biting on a dry tea bag placed over the extraction site for 30 minutes.  If the bleeding does not stop despite repeated attempts as above, please call [phone].      2.  Avoid excessive exertion for 72 hours after your procedure.  No bending, lifting, strenuous activity,sports or gym-class.  After this time, resume normal activity gradually as you become comfortable.      3.  Mouth opening may be limited during the first week to ten days after surgery.  This is expected due to swelling and tightness of the jaw muscles.     4.  Bruising (black and blue areas) may develop after surgery.  Should this occur, expect bruising to  resolve after the first week to ten days.     5.   Earache or TMJ (jaw joint) tenderness may develop that will gradually resolve over the first week.     6.   NO ORAL CARE during the first 24 hours.  Beginning on the day after surgery, rinse your mouth after meals (or 4-5 times per day) with warm salt water (1/2 teaspoon of salt in an 8oz. glass of warm water).  A prescription mouth rinse may be given in selected cases. Begin tooth brushing on the day after surgery, as you normally would however this should be performed gently in the area of extractions.     7.   DO NOT USE STRAWS for the first week after surgery.  Straws create suction pressure inside your mouth and could dislodge the blood clots that normally form in the extraction sites.      8.   DO NOT SMOKE during the first 21 days after surgery.  Smoking greatly increases the risk of  and other healing complications including infection, pain, and dry socket.            9.   SWELLING is normal and will maximize 48-72 hours after surgery.  Apply ice in a damp towel against your cheeks for the first 24-48 hours, as follows: 20 minutes on 40 minutes off, then repeat. Keep your head elevated   with 2 or 3 pillows when lying down during the first 24 hours.  This will reduce bleeding, swelling and improve your level of comfort.  May apply VaselineR or ChapstickR to the lips to keep them moist.      10.    Mild to moderate pain may be encountered after the local anesthetic wears off. If allowed by your health history, take two Extra-Strength TylenolR (500mg ea.) or two MotrinR/AdvilR/Ibuprofen (200mg ea).  Avoid aspirin-containing products since these may lead to more bleeding.  For moderate to severe pain, take the prescription medication as prescribed for you.  Do not operate heavy machinery or make important decisions while on narcotics.          11.  In case of uncontrolled bleeding, extreme pain or unusual circumstances, please call the office.       POSTOPERATIVE  DIET SUGGESTIONS    1.  Avoid eating or drinking anything within the first hour after surgery.  After this time, drink lots of liquids and eat soft, cool foods.  Avoid excessively hot liquids and foods for the first 24 hours.             2.  During the first week, avoid hard, crispy foods such as chips, hard pretzels, and popcorn.            3.   The following food choices are recommended (“liquid foods on the day of the procedure, soft “mushy” foods during the first week advancing to regular food as tolerated”):      a. During the first few days, blenderized foods, soup broth, well-cooked pasta, eggs, yogurt, cottage cheese, Jell-O, pudding, custard, applesauce, bananas and ice cream.       b. After the first few days, gradually increase your diet to include: fish, chicken, meatloaf, mashed potatoes, cooked vegetables, soups and cereals, etc…         SINUS PRECAUTIONS (if indicated)    1.  Keep your head elevated during sleep with two to three pillows.  2.  Do NOT blow your nose!  3.  If you sneeze do not sneeze through the nose and do not “hold back.”  You may sneeze with your mouth open.    4.  Expect slight nose bleeds.  Call the office for heavy bleeding from the nose that you are unable to control with pressure.   5.  Do NOT do anything that requires a sucking-in or blowing-out motion such as smoking, use of straws or blowing up balloons.   6.  Take all prescribed and/or recommended medications as directed.

## 2016-02-11 NOTE — Procedures (Signed)
Date: 02/11/2016           Patient Name: Heather Callahan   Patient DOB: 1996/02/02      Encounter Diagnosis:    1. Impacted third molar tooth  dexamethasone (DECADRON) injection 8 mg    fentaNYL (SUBLIMAZE) injection 100 mcg    ketamine (KETALAR) 10 MG/ML injection 15 mg    midazolam (VERSED) injection 5 mg    propofol (DIPRIVAN) injection 60 mg    Vital signs    Insert peripheral IV    Nasal cannula oxygen    Vital signs    Remove PIV  line       Planned Procedure:   Chief Complaint   Patient presents with    Procedure     Extraction teeth #1, 16, 17, 32 with IV sedation        Anesthesia: IV Sedation    I reviewed technical details, risks, benefits, and alternative treatment and anesthetic options with the patient (and family).  An opportunity was given for questions.  All questions were answered to the apparent satisfaction of the patient and family.  All wish to proceed. The consent forms for sedation and the procedure were signed.    Health History: Reviewed and in chart, with any changes updated since consultation visit.      ASA  Class : II    Exam:  Malampati: II  Anesthesia as per record.      Local Anesthetic was administered via blocks and infiltrations.           # of carpules  2% Lidocaine with 1:100000 Epi              6    Procedure:     The referral, consult documentation, imaging, and finalized treatment plan were reviewed with Dr. Athena Masse prior to the start of the procedure.    A time out was performed. IV sedation medications were administered as per the sedation record. Vitals per the sedation record. Profound local anesthesia was achieved. A full thickness mucoperiosteal flap was reflected from teeth #1, 16, 17, 32 via sulcular incision using a #15 blade and periosteal elevator. Distobuccal release utilized at sites #17, 32. A buccal trough was created and the tooth sectioned as required at site #17 utilizing high speed surgical handpiece with copious irrigation. The teeth were  mobilized with a 77R elevator and delivered with forceps. The sockets were curetted and a Rongeur and bone file used to remove areas of sharp bone and bony undercuts where necessary. The sockets were compressed with digital pressure as needed. The sockets and mucoperiosteal flaps were irrigated with copious saline.The IAN was not visualized, no sinus exposure was detected, and the lingual plates remained intact without evidence of perforation. 3-0 chromic sutures were placed at site #17. Hemostasis was achieved with the patient biting on gauze. The patient tolerated the procedure well.    Throat screen: yes  Bite Block / Jaw stabilized for TMJ prophylaxis: yes     Throat screen removed: yes   Oral gauze packs placed for hemostasis: yes    Monitored in recovery for a minimum of 30 minutes until alert and vital signs stable then discharged [id number]    The wounds were hemostatic at discharge.    Complications:  None    Rx Medications: Norco and Motrin     Postoperative instructions were given written and verbal and explained to the patient and escort. All questions were answered. The escort confirmed understanding and a copy  of the discharge instructions was signed by the escort at this time.    Follow up appointment arranged for: PRN    Please see the sedation record for vitals, medication administration, and assessments.     Dr. Athena MasseKolokythas supervised the procedure and was immediately available for the entirety of the procedure and sedation.    A copy of the AVS was provided to the patient at this time.     Deep Sedation Face Times  Start Time: 1345  End Time: 1406  Duration (minutes): 21 Minutes       Olene Flossharles [place], DDS  OMFS Resident

## 2016-02-11 NOTE — Invasive Procedure Plan of Care (Signed)
Invasive Procedure Plan of Care (Consent Form 419):   Condition(s) Addressed: Anxiety   Person Performing Procedure: Saddie Benders, and OMFS Attendings: Jannett Celestine, Neldon Mc, Lillia Pauls, Cyndia Diver, Valora Corporal, Richard Oletta Lamas, Serena Colonel, Loney Hering, Izora Ribas   Side: Not applicable    Procedure: Deep Sedation    Special Equipment: Capnograph monitoring   Planned Anesthesia: General   Benefits: Altered consciousness to promote comfort during procedure, sleepiness, and very relaxed state.  Possible amnesia to the procedure.   Risks: Need for assistance with breathing, excessive sedation which may require special procedures, including intubation and/or placement of breathing tube, nausea and vomiting, unstable heart rhythms, breathing difficulties, and death   Alternatives:    Expected Length of Stay: 0 day(s)   Pt Decision: Agrees to proceed     ----------------------------------------------------------------------------------------------------------------------------------------  Consent:  I hereby give my consent and authorize Saddie Benders, and OMFS Attendings: Jannett Celestine, Neldon Mc, Lillia Pauls, Cyndia Diver, Valora Corporal, Richard Oletta Lamas, Serena Colonel, Nicholas Schellati, Izora Ribas  (The list of possible assistants, all of whom are privileged to provide surgical services at the hospital, is available)  To treat the following: Anxiety  Procedure includes: Deep Sedation   1 The care provider has explained my condition to me, the benefits of having the above treatment procedure, and alternate ways of treating my condition. I understand that no guarantees have been made to me about the result of the treatment. The alternatives to this procedure include:    2 The care provider has discussed with me the reasonably foreseeable risks of the treatment and that there may be undesirable results. The risks that are specifically related to this procedure include:  Need for assistance with breathing, excessive sedation which may require special procedures, including intubation and/or placement of breathing tube, nausea and vomiting, unstable heart rhythms, breathing difficulties, and death   3 I understand that during the treatment a condition may be discovered which was not known before the treatment started. Therefore, I authorize the care provider to perform any additional or different treatment which is thought necessary and available.   4 Any tissue, parts, or substances removed during the procedure may be retained or disposed of in accordance with customary scientific, educational and clinical practice.   5 Vendor information if appropriate: If a vendor representative is expected to be present during my procedure, it has been explained to me that the vendor representative works for (manufacturer of the device to be used) and that his/her role includes . I consent to the vendor representative's presence and involvement as described. If circumstances change and a decision is made during my procedure that a vendor representative's presence is needed, I will be notified of the above after my procedure is completed.  Equipment: Capnograph monitoring     6 Patient Consent for Medical or Surgical Procedure: I have carefully read and fully understand this informed consent form, and have had sufficient opportunity to discuss my condition and the above procedure(s) with the care provider and his/her associates, and all of my questions have been answered to my satisfaction.    Agrees to proceed       ____________________________________________________   _______________   Signature of Patient  Date/Time     ____________________________________________________   _______________   Signature of Parent or Legal Guardian  (if Patent is unable to sign or is a minor) Date/Time       Complete this section for all OR procedures and all other invasive internal procedures  performed in any  setting.  7 Consent for Receipt of Tissue(s): Not applicable   8 For those procedures that have the potential for significant blood loss: not applicable.   9 Patient Consent for Blood/Tissue: Not applicable.    Patient sign here unless 7 and 8 do not apply.       ____________________________________________________   _______________   Signature of Patient Date/Time     ____________________________________________________   _______________   Signature of Parent or Legal Guardian  (if Patent is unable to sign or is a minor) Date/Time      I have discussed the planned procedure, including the potential for any transfusion of blood products or receipt of tissue as necessary, expected benefits, the potential complications and risks and possible alternatives and their benefits and risks with the patient or the patient's surrogate. In my opinion, the patent or the patient's surrogate understands the proposed procedure, its risks, benefits, and alternatives.    Electronically signed by Olene Flossharles Glorya Bartley, DDS at 1:22 PM

## 2016-03-20 ENCOUNTER — Emergency Department (HOSPITAL_COMMUNITY)
Admission: EM | Admit: 2016-03-20 | Discharge: 2016-03-20 | Disposition: A | Discharge status: Home / Self Care | Payer: Medicaid Other | Attending: Emergency Medicine | Admitting: Emergency Medicine

## 2016-03-20 ENCOUNTER — Encounter (HOSPITAL_COMMUNITY): Payer: Self-pay | Admitting: Emergency Medicine

## 2016-03-20 DIAGNOSIS — Z79899 Other long term (current) drug therapy: Secondary | ICD-10-CM | POA: Insufficient documentation

## 2016-03-20 DIAGNOSIS — M62838 Other muscle spasm: Secondary | ICD-10-CM | POA: Insufficient documentation

## 2016-03-20 DIAGNOSIS — R0981 Nasal congestion: Secondary | ICD-10-CM | POA: Diagnosis present

## 2016-03-20 MED ORDER — CYCLOBENZAPRINE HCL 10 MG PO TABS
10.0000 mg | ORAL_TABLET | Freq: Once | ORAL | Status: AC
  Administered 2016-03-20: 10 mg via ORAL
  Filled 2016-03-20: qty 1

## 2016-03-20 MED ORDER — IBUPROFEN 100 MG/5ML PO SUSP
600.0000 mg | Freq: Once | ORAL | Status: AC
Start: 2016-03-20 — End: 2016-03-20
  Administered 2016-03-20: 600 mg via ORAL
  Filled 2016-03-20: qty 30

## 2016-03-20 MED ORDER — CYCLOBENZAPRINE HCL 10 MG PO TABS: 10.0000 mg | ORAL_TABLET | Freq: Three times a day (TID) | ORAL | 0 refills | Status: DC | PRN

## 2016-03-20 MED ORDER — IBUPROFEN 600 MG PO TABS: 600.0000 mg | ORAL_TABLET | Freq: Three times a day (TID) | ORAL | 0 refills | Status: DC | PRN
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
## 2016-03-20 NOTE — ED Triage Notes (Signed)
Pt assessed, assessment same as triage note.

## 2016-03-20 NOTE — ED Triage Notes (Signed)
Pt presents to ED with family, Pt c/o breaking out in hives on Wednesday. Pt took benadryl and symptoms resolved Wednesday night. Since then pt has developed cold symptoms - nasal congestion and cough. Pt then woke up with morning with a stiff neck. Pt sts pain is worse when moving head towards the L side. Pt can move to R side without difficulty. A&Ox4 and ambulatory. Denies N/V/ fever.

## 2016-07-18 ENCOUNTER — Ambulatory Visit: Payer: Self-pay

## 2016-07-18 DIAGNOSIS — Z23 Encounter for immunization: Secondary | ICD-10-CM

## 2016-07-18 NOTE — Progress Notes (Signed)
The injection was tolerated well by the patient.

## 2018-03-06 ENCOUNTER — Other Ambulatory Visit
Admission: RE | Admit: 2018-03-06 | Discharge: 2018-03-06 | Disposition: A | Discharge status: Home / Self Care | Payer: Medicaid Other | Source: Ambulatory Visit | Attending: Obstetrics and Gynecology | Admitting: Obstetrics and Gynecology

## 2018-03-06 DIAGNOSIS — Z124 Encounter for screening for malignant neoplasm of cervix: Secondary | ICD-10-CM | POA: Insufficient documentation

## 2018-03-14 LAB — GYN CYTOLOGY

## 2018-06-28 ENCOUNTER — Encounter: Payer: Self-pay | Admitting: Gastroenterology

## 2018-06-28 NOTE — Progress Notes (Signed)
Received an inquiry that this patient is interested in becoming a new patient at [facility].  Attempted to contact the patient and left a voicemail for patient to return our call if they're still interested in becoming a patient.

## 2018-08-30 ENCOUNTER — Ambulatory Visit: Payer: Medicaid Other | Admitting: Primary Care

## 2018-09-30 ENCOUNTER — Encounter: Payer: Self-pay | Admitting: Primary Care

## 2018-09-30 ENCOUNTER — Ambulatory Visit: Payer: Medicaid Other | Attending: Primary Care | Admitting: Primary Care

## 2018-09-30 VITALS — BP 128/72 | HR 99 | Temp 98.5°F | Ht 65.0 in | Wt 261.0 lb

## 2018-09-30 DIAGNOSIS — Z6841 Body Mass Index (BMI) 40.0 and over, adult: Secondary | ICD-10-CM

## 2018-09-30 DIAGNOSIS — Z23 Encounter for immunization: Secondary | ICD-10-CM

## 2018-09-30 DIAGNOSIS — Z0000 Encounter for general adult medical examination without abnormal findings: Secondary | ICD-10-CM

## 2018-09-30 NOTE — Progress Notes (Signed)
HPI/PAST MEDICAL/SURGICAL/GYN HISTORY:    HPI (recent events) and patient concerns:     New patient visit  Pap is UTD  Concerned about her weight-diabetes screening     The patient's medication list was populated in eRecord and reviewed by myself today.  These medications include:    Current Outpatient Medications:     LORYNA 3-0.02 MG per tablet, 1 BY MOUTH EVERY DAY, Disp: , Rfl: 2    ibuprofen (ADVIL,MOTRIN) 600 MG tablet, Take 1 tablet (600 mg total) by mouth 3 times daily as needed, Disp: 30 tablet, Rfl: 0    polyethylene glycol (GLYCOLAX,MIRALAX) powder packet, 17 g daily as needed, Disp: , Rfl:       Allergies were populated in eRecord and reviewed by myself today.  These allergies include:  No Known Allergies (drug, envir, food or latex)      Past medical, surgical, social and family histories were updated in eRecord and reviewed by myself today.  This history includes:    Past Medical History:   Diagnosis Date    Functional constipation     GERD (gastroesophageal reflux disease)     IBS (irritable bowel syndrome)     Low TSH level     Maternal hypothyroidism 12/28/2014       Past Surgical History:   Procedure Laterality Date    TONSILLECTOMY      TONSILLECTOMY         Family History   Problem Relation Age of Onset    Thyroid disease Mother     Alcohol abuse Father     Breast cancer Paternal Grandmother 30    Bladder Cancer Paternal Grandmother     Diabetes Paternal Grandmother     High Blood Pressure Paternal Grandmother     No Known Problems Sister     Schizophrenia Brother     Alcohol abuse Paternal Uncle     Substance abuse Paternal Uncle     Alcohol abuse Paternal Grandfather        Social History:   reports that she has never smoked. She has never used smokeless tobacco. She reports that she does not drink alcohol, use drugs, or engage in sexual activity.  --smoking cessation needed?  None    Sexual History:  Contraceptive plan: Not active    Menstrual history:  -dysmenorrhea:   No  -intermenstrual spotting? no  -postmenopausal bleeding?  N/A  -STD history or concerns:  Declined    Pap history:  03/2018-sat     Safety:  --wears seatbelt sometimes  --wears cycling helmet not applicable  --smoke detectors present Yes  --dental care:  Discussed 2018-  --eye care:  Discussed 2018-  --history of domestic violence no    ROS:   Gen:  No headaches, dizziness and feels generally well  Eyes: No recent visual changes  HENT:  No nasal discharge, throat pain, hearing unchanged  CV:  No palpitations or chest pain, peripheral edema or claudication  Resp: No wheezing or shortness of breath  GI:  No nausea, vomiting; eating and drinking normally  GU:  Normal urination and bowel movements  MSK: No joint or muscle aches, normal gait  Skin:  No new rashes or lesions  Neuro: No paresthesias  Psych:  Mood stable  Endo: No extraordinary fatigue, skin/hair changes  Heme: No easy bruising    Vitals:    09/30/18 1059   BP: 128/72   Pulse: 99   Temp: 36.9 C (98.5 F)   Weight: 118.4 kg (  261 lb)   Height: 1.651 m (5\' 5" )       Exam:  Constitutional: Well-developed, cooperative.     Physical examination deferred given COVID19 pandemic to minimize physical contact  No indication for hands on physical exam at this time      Assessment:   --Well 22 y.o.year old female  --Diagnoses and orders:      1. Physical exam     2. Immunization due  Tdap >/= 27yr(Boostrix)   3. BMI 40.0-44.9, adult  Hemoglobin A1c    Lipid Panel (Reflex to Direct  LDL if Triglycerides more than 400)    TSH        Plan:  Repeat exam in 1 year(s)       Immunizations:   Flu : not indicated  Tdap : ordered 2020-  HPV series: not indicated  Pneumovax : not indicated  HepB series (DM) : not indicated    Zostavax >60 : not indicated    USPSTF Recommendations:    All women:  Alcohol abuse screening :  reports no history of alcohol use.; negative screen  High blood pressure: BP: 128/72; negative screen  BP >135/80, A1c screen : standing order  Smoking  counseling : no  Depression screening : no  HIV screening declined  Obesity screen : Body mass index is 43.43 kg/m., positive    Age specific:  <25, gonorrhea/chlamydia/RPR screening : declined  >21 Pap smear, stop age 62 : not indicated    >45 lipids : standing order  <50, folic acid supplementation : not active.    >50, mammogram q74yrs : not indicated  50-75 colonscopy : not indicated  >47yrs, ASA prevention : not indicated  >65 DEXA scan, or age 5 with risk factors : not indicated    Other:  Health care proxy: PGM Bernita Buffy [phone]    Jeanmarie Plant, MD 11:52 AM  Family Medicine

## 2019-04-16 ENCOUNTER — Other Ambulatory Visit
Admission: RE | Admit: 2019-04-16 | Discharge: 2019-04-16 | Disposition: A | Discharge status: Home / Self Care | Payer: Medicaid Other | Source: Ambulatory Visit | Attending: Primary Care | Admitting: Primary Care

## 2019-04-16 ENCOUNTER — Encounter: Payer: Self-pay | Admitting: Primary Care

## 2019-04-16 DIAGNOSIS — Z124 Encounter for screening for malignant neoplasm of cervix: Secondary | ICD-10-CM | POA: Insufficient documentation

## 2019-04-16 DIAGNOSIS — R7303 Prediabetes: Secondary | ICD-10-CM | POA: Insufficient documentation

## 2019-04-16 DIAGNOSIS — Z6841 Body Mass Index (BMI) 40.0 and over, adult: Secondary | ICD-10-CM

## 2019-04-16 LAB — LIPID PANEL
Chol/HDL Ratio: 3.8
Cholesterol: 218 mg/dL — AB
HDL: 57 mg/dL (ref 40–60)
LDL Calculated: 134 mg/dL — AB
Non HDL Cholesterol: 161 mg/dL
Triglycerides: 136 mg/dL

## 2019-04-16 LAB — TSH: TSH: 1.46 u[IU]/mL (ref 0.27–4.20)

## 2019-04-16 LAB — HEMOGLOBIN A1C: Hemoglobin A1C: 5.7 % — ABNORMAL HIGH

## 2019-04-16 NOTE — Result Encounter Note (Signed)
Released to pt through MyChart message.

## 2019-06-18 ENCOUNTER — Ambulatory Visit: Payer: Medicaid Other | Admitting: Primary Care

## 2019-06-18 DIAGNOSIS — R5383 Other fatigue: Secondary | ICD-10-CM

## 2019-06-18 DIAGNOSIS — R7303 Prediabetes: Secondary | ICD-10-CM

## 2019-06-18 NOTE — Progress Notes (Signed)
[facility]   Outpatient Progress Note    Subjective    1. Prediabetes    2. Other fatigue        Patient seen today with concerns of fatigue over the past 3 to 4 days.  She states that historically this means for her that she tends to have iron deficiency anemia.  States that she does not eat red meat but some chicken.  No fish.  Sometimes eats beans.  Rarely green veggies but she does eat broccoli.  She does not currently take any type of iron supplementation.    Prediabetes-this is a new diagnosis for her for her.  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    ROS: as per HPI, and I personally reviewed the patients medication and problem list in Epic today immediatly prior and/or during our visit.    Objective    There were no vitals taken for this visit.     Physical Exam:    Patient is in no acute distress on video visit.  Good eye contact.  She exhibits normal work of breathing    Assessment & Plan:    1. Prediabetes  - Hemoglobin A1c; Standing    2. Other fatigue  We discussed checking her CBC for evidence of iron deficiency anemia which is her major concern today.  In addition repeat A1c.  We discussed sources of iron rich foods.  Since she has a nutritionist I wondered aloud with her if this is something that she and her nutritionist could start working on together  - CBC and differential; Future  - Ferritin; Future        RTC: As needed    Due to pandemic event, visit performed via:        Video   Location of Patient: home  Location of Telemedicine Provider: hospital / clinical location   Other participants in telemedicine encounter and roles: n/a    Consent was obtained from the patient to complete this video visit; including the potential for financial liability.    This visit was performed during a pandemic event and the vital signs including BMI and physical exam are limited or missing due to the patient's location.                 Rhona Leavens, MD  06/18/2019 at 5:00  PM  Family Medicine  [phone]

## 2019-07-31 ENCOUNTER — Encounter (HOSPITAL_COMMUNITY): Payer: Self-pay

## 2019-07-31 ENCOUNTER — Emergency Department (HOSPITAL_COMMUNITY)
Admission: EM | Admit: 2019-07-31 | Discharge: 2019-08-01 | Disposition: A | Discharge status: Home / Self Care | Payer: Self-pay | Attending: Emergency Medicine | Admitting: Emergency Medicine

## 2019-07-31 ENCOUNTER — Other Ambulatory Visit: Payer: Self-pay

## 2019-07-31 DIAGNOSIS — R112 Nausea with vomiting, unspecified: Secondary | ICD-10-CM | POA: Insufficient documentation

## 2019-07-31 DIAGNOSIS — R1033 Periumbilical pain: Secondary | ICD-10-CM | POA: Insufficient documentation

## 2019-07-31 DIAGNOSIS — R197 Diarrhea, unspecified: Secondary | ICD-10-CM | POA: Insufficient documentation

## 2019-07-31 DIAGNOSIS — R509 Fever, unspecified: Secondary | ICD-10-CM | POA: Insufficient documentation

## 2019-07-31 DIAGNOSIS — R1030 Lower abdominal pain, unspecified: Secondary | ICD-10-CM

## 2019-07-31 LAB — CBC
HCT: 31.1 % — ABNORMAL LOW (ref 36.0–46.0)
Hemoglobin: 10.6 g/dL — ABNORMAL LOW (ref 12.0–15.0)
MCH: 29.9 pg (ref 26.0–34.0)
MCHC: 34.1 g/dL (ref 30.0–36.0)
MCV: 87.9 fL (ref 80.0–100.0)
Platelets: 285 10*3/uL (ref 150–400)
RBC: 3.54 MIL/uL — ABNORMAL LOW (ref 3.87–5.11)
RDW: 11.9 % (ref 11.5–15.5)
WBC: 11.4 10*3/uL — ABNORMAL HIGH (ref 4.0–10.5)
nRBC: 0 % (ref 0.0–0.2)

## 2019-07-31 LAB — URINALYSIS, ROUTINE W REFLEX MICROSCOPIC
Bilirubin Urine: NEGATIVE
Glucose, UA: NEGATIVE mg/dL
Hgb urine dipstick: NEGATIVE
Ketones, ur: NEGATIVE mg/dL
Nitrite: NEGATIVE
Protein, ur: 100 mg/dL — AB
Specific Gravity, Urine: 1.011 (ref 1.005–1.030)
pH: 6 (ref 5.0–8.0)

## 2019-07-31 LAB — I-STAT BETA HCG BLOOD, ED (MC, WL, AP ONLY): I-stat hCG, quantitative: <5 m[IU]/mL (ref <5)

## 2019-07-31 MED ORDER — SODIUM CHLORIDE 0.9 % IV BOLUS
1000.0000 mL | Freq: Once | INTRAVENOUS | Status: AC
  Administered 2019-07-31: 23:00:00 1000 mL via INTRAVENOUS

## 2019-07-31 MED ORDER — ACETAMINOPHEN 500 MG PO TABS
1000.0000 mg | ORAL_TABLET | Freq: Once | ORAL | Status: AC
  Administered 2019-07-31: 23:00:00 1000 mg via ORAL
  Filled 2019-07-31: qty 2

## 2019-07-31 MED ORDER — ONDANSETRON HCL 4 MG/2ML IJ SOLN
4.0000 mg | Freq: Once | INTRAMUSCULAR | Status: AC
  Administered 2019-07-31: 23:00:00 4 mg via INTRAVENOUS
  Filled 2019-07-31: qty 2

## 2019-07-31 MED ORDER — SODIUM CHLORIDE 0.9% FLUSH
3.0000 mL | Freq: Once | INTRAVENOUS | Status: AC
  Administered 2019-07-31: 23:00:00 3 mL via INTRAVENOUS

## 2019-07-31 MED ORDER — MORPHINE SULFATE (PF) 4 MG/ML IV SOLN
4.0000 mg | Freq: Once | INTRAVENOUS | Status: AC
  Administered 2019-07-31: 23:00:00 4 mg via INTRAVENOUS
  Filled 2019-07-31: qty 1

## 2019-07-31 NOTE — ED Triage Notes (Signed)
Pt reports abdominal pain "behind her belly button and around both sides to her back." States that she has been unable to keep down food or water since Friday. Pt is febrile in triage. States she has not been taking medication and that she has trouble taking pills. Denies urinary symptoms, but reports one episode of diarrhea earlier this week, but not consistently.

## 2019-07-31 NOTE — ED Provider Notes (Signed)
[facility] DEPT Provider Note   CSN: [id number] Arrival date & time: 07/31/19  2155     History Chief Complaint  Patient presents with  . Abdominal Pain    Heather Morris is a 24 y.o. female.  24 yo F with a chief complaints of lower abdominal pain and fever.  Is been going on for almost a week now.  Feels that the pain is behind her bellybutton and radiates to bilateral flanks.  Pain seems pretty constant.  Having fevers off and on.  Nausea and vomiting.  Difficulty tolerating p.o. over the past 48 hours or so.  Some loose stools.  No cough or congestion.  No sick contacts.  The history is provided by the patient.  Abdominal Pain Pain location:  Periumbilical Pain quality: sharp and shooting   Pain radiates to:  R flank and L flank Pain severity:  Moderate Onset quality:  Gradual Duration:  1 week Timing:  Constant Progression:  Worsening Chronicity:  New Relieved by:  Nothing Worsened by:  Nothing Ineffective treatments:  None tried Associated symptoms: chills, diarrhea, fever, nausea and vomiting   Associated symptoms: no chest pain, no dysuria and no shortness of breath        History reviewed. No pertinent past medical history.  Patient Active Problem List   Diagnosis Date Noted  . Pain in joint, ankle and foot 11/28/2013    History reviewed. No pertinent surgical history.   OB History   No obstetric history on file.     Family History  Problem Relation Age of Onset  . Hypertension Mother   . Hypertension Father     Social History   Tobacco Use  . Smoking status: Never Smoker  . Smokeless tobacco: Never Used  Substance Use Topics  . Alcohol use: No  . Drug use: Not on file    Home Medications Prior to Admission medications   Medication Sig Start Date End Date Taking? Authorizing Provider  benzonatate (TESSALON) 100 MG capsule Take 1 capsule (100 mg total) by mouth 3 (three) times daily as needed for  cough. Patient not taking: Reported on 07/31/2019 06/04/13   Lutricia Feil, PA  cyclobenzaprine (FLEXERIL) 10 MG tablet Take 1 tablet (10 mg total) by mouth 3 (three) times daily as needed for muscle spasms. Patient not taking: Reported on 07/31/2019 03/20/16   Jola Schmidt, MD  ibuprofen (ADVIL,MOTRIN) 600 MG tablet Take 1 tablet (600 mg total) by mouth every 8 (eight) hours as needed. Patient not taking: Reported on 07/31/2019 03/20/16   Jola Schmidt, MD  ondansetron (ZOFRAN) 4 MG tablet Take 1 tablet (4 mg total) by mouth every 8 (eight) hours as needed for nausea or vomiting. Patient not taking: Reported on 07/31/2019 06/04/13   Lutricia Feil, PA  predniSONE (STERAPRED UNI-PAK) 5 MG TABS tablet Dose pack for 12 days. Patient not taking: Reported on 07/31/2019 11/19/13   Waldemar Dickens, MD    Allergies    Fish allergy  Review of Systems   Review of Systems  Constitutional: Positive for chills and fever.  HENT: Negative for congestion and rhinorrhea.   Eyes: Negative for redness and visual disturbance.  Respiratory: Negative for shortness of breath and wheezing.   Cardiovascular: Negative for chest pain and palpitations.  Gastrointestinal: Positive for abdominal pain, diarrhea, nausea and vomiting.  Genitourinary: Negative for dysuria and urgency.  Musculoskeletal: Negative for arthralgias and myalgias.  Skin: Negative for pallor and wound.  Neurological: Negative  for dizziness and headaches.    Physical Exam Updated Vital Signs BP (!) 145/90 (BP Location: Left Arm)   Pulse (!) 124   Temp (!) 102.3 F (39.1 C) (Oral)   Resp 16   SpO2 100%   Physical Exam Vitals and nursing note reviewed.  Constitutional:      General: She is not in acute distress.    Appearance: She is well-developed. She is not diaphoretic.  HENT:     Head: Normocephalic and atraumatic.  Eyes:     Pupils: Pupils are equal, round, and reactive to light.  Cardiovascular:     Rate and  Rhythm: Regular rhythm. Tachycardia present.     Heart sounds: No murmur. No friction rub. No gallop.   Pulmonary:     Effort: Pulmonary effort is normal.     Breath sounds: No wheezing or rales.  Abdominal:     General: There is no distension.     Palpations: Abdomen is soft.     Tenderness: There is abdominal tenderness in the suprapubic area.     Comments: Tenderness is worse to the suprapubic region.  She does have some pain to the right lower quadrant.  No CVA tenderness.  Musculoskeletal:        General: No tenderness.     Cervical back: Normal range of motion and neck supple.  Skin:    General: Skin is warm and dry.  Neurological:     Mental Status: She is alert and oriented to person, place, and time.  Psychiatric:        Behavior: Behavior normal.     ED Results / Procedures / Treatments   Labs (all labs ordered are listed, but only abnormal results are displayed) Labs Reviewed  COMPREHENSIVE METABOLIC PANEL - Abnormal; Notable for the following components:      Result Value   Potassium 2.9 (*)    Glucose, Bld 103 (*)    Total Protein 9.3 (*)    All other components within normal limits  CBC - Abnormal; Notable for the following components:   WBC 11.4 (*)    RBC 3.54 (*)    Hemoglobin 10.6 (*)    HCT 31.1 (*)    All other components within normal limits  URINALYSIS, ROUTINE W REFLEX MICROSCOPIC - Abnormal; Notable for the following components:   APPearance HAZY (*)    Protein, ur 100 (*)    Leukocytes,Ua TRACE (*)    Bacteria, UA RARE (*)    All other components within normal limits  LIPASE, BLOOD  I-STAT BETA HCG BLOOD, ED ([facility], [facility], [facility] ONLY)    EKG None  Radiology No results found.  Procedures Procedures (including critical care time)  Medications Ordered in ED Medications  sodium chloride flush (NS) 0.9 % injection 3 mL (3 mLs Intravenous Given 07/31/19 2322)  sodium chloride 0.9 % bolus 1,000 mL (0 mLs Intravenous Stopped 08/01/19 0028)  morphine  4 MG/ML injection 4 mg (4 mg Intravenous Given 07/31/19 2322)  ondansetron (ZOFRAN) injection 4 mg (4 mg Intravenous Given 07/31/19 2323)  acetaminophen (TYLENOL) tablet 1,000 mg (1,000 mg Oral Given 07/31/19 2323)  potassium chloride SA (KLOR-CON) CR tablet 40 mEq (40 mEq Oral Given 08/01/19 0029)  magnesium oxide (MAG-OX) tablet 800 mg (800 mg Oral Given 08/01/19 0030)    ED Course  I have reviewed the triage vital signs and the nursing notes.  Pertinent labs & imaging results that were available during my care of the patient were reviewed  by me and considered in my medical decision making (see chart for details).    MDM Rules/Calculators/A&P                      24 yo F with a chief complaints of lower abdominal pain.  Going on for almost a week now.  Pain is worse on the superpubic region.  With radiation to the flanks will obtain a stone study to evaluate for kidney stone.  UA.  Reassess.  Labwork with mild leukocytosis.  No LFT elevation. UA dirty sample and without obvious infection.   Signed out to Dr Read Drivers please see his note for further details of care in the ED.   The patients results and plan were reviewed and discussed.   Any x-rays performed were independently reviewed by myself.   Differential diagnosis were considered with the presenting HPI.  Medications  sodium chloride flush (NS) 0.9 % injection 3 mL (3 mLs Intravenous Given 07/31/19 2322)  sodium chloride 0.9 % bolus 1,000 mL (0 mLs Intravenous Stopped 08/01/19 0028)  morphine 4 MG/ML injection 4 mg (4 mg Intravenous Given 07/31/19 2322)  ondansetron (ZOFRAN) injection 4 mg (4 mg Intravenous Given 07/31/19 2323)  acetaminophen (TYLENOL) tablet 1,000 mg (1,000 mg Oral Given 07/31/19 2323)  potassium chloride SA (KLOR-CON) CR tablet 40 mEq (40 mEq Oral Given 08/01/19 0029)  magnesium oxide (MAG-OX) tablet 800 mg (800 mg Oral Given 08/01/19 0030)    Vitals:   07/31/19 2225  BP: (!) 145/90  Pulse: (!) 124  Resp: 16   Temp: (!) 102.3 F (39.1 C)  TempSrc: Oral  SpO2: 100%    Final diagnoses:  Lower abdominal pain  Fever, unspecified fever cause      Final Clinical Impression(s) / ED Diagnoses Final diagnoses:  Lower abdominal pain  Fever, unspecified fever cause    Rx / DC Orders ED Discharge Orders    None       Melene Plan, DO 08/01/19 2043

## 2019-08-01 ENCOUNTER — Emergency Department (HOSPITAL_COMMUNITY)
Admission: EM | Admit: 2019-08-01 | Discharge: 2019-08-02 | Disposition: A | Discharge status: Home / Self Care | Payer: Self-pay | Attending: Emergency Medicine | Admitting: Emergency Medicine

## 2019-08-01 ENCOUNTER — Emergency Department (HOSPITAL_COMMUNITY): Payer: Self-pay

## 2019-08-01 ENCOUNTER — Encounter (HOSPITAL_COMMUNITY): Payer: Self-pay | Admitting: *Deleted

## 2019-08-01 ENCOUNTER — Other Ambulatory Visit: Payer: Self-pay

## 2019-08-01 DIAGNOSIS — N12 Tubulo-interstitial nephritis, not specified as acute or chronic: Secondary | ICD-10-CM | POA: Insufficient documentation

## 2019-08-01 DIAGNOSIS — Z20822 Contact with and (suspected) exposure to covid-19: Secondary | ICD-10-CM | POA: Insufficient documentation

## 2019-08-01 LAB — COMPREHENSIVE METABOLIC PANEL
ALT: 18 U/L (ref 0–44)
AST: 16 U/L (ref 15–41)
Albumin: 4.4 g/dL (ref 3.5–5.0)
Alkaline Phosphatase: 43 U/L (ref 38–126)
Anion gap: 8 (ref 5–15)
BUN: 6 mg/dL (ref 6–20)
CO2: 28 mmol/L (ref 22–32)
Calcium: 9.4 mg/dL (ref 8.9–10.3)
Chloride: 102 mmol/L (ref 98–111)
Creatinine, Ser: 0.76 mg/dL (ref 0.44–1.00)
GFR calc Af Amer: >60 mL/min (ref >60)
GFR calc non Af Amer: >60 mL/min (ref >60)
Glucose, Bld: 103 mg/dL — ABNORMAL HIGH (ref 70–99)
Potassium: 2.9 mmol/L — ABNORMAL LOW (ref 3.5–5.1)
Sodium: 138 mmol/L (ref 135–145)
Total Bilirubin: 1.1 mg/dL (ref 0.3–1.2)
Total Protein: 9.3 g/dL — ABNORMAL HIGH (ref 6.5–8.1)

## 2019-08-01 LAB — LIPASE, BLOOD: Lipase: 29 U/L (ref 11–51)

## 2019-08-01 MED ORDER — ONDANSETRON 8 MG PO TBDP: 8.0000 mg | ORAL_TABLET | Freq: Three times a day (TID) | ORAL | 0 refills | Status: AC | PRN

## 2019-08-01 MED ORDER — POTASSIUM CHLORIDE CRYS ER 20 MEQ PO TBCR
40.0000 meq | EXTENDED_RELEASE_TABLET | Freq: Once | ORAL | Status: AC
  Administered 2019-08-01: 40 meq via ORAL
  Filled 2019-08-01: qty 2

## 2019-08-01 MED ORDER — MAGNESIUM OXIDE 400 (241.3 MG) MG PO TABS
800.0000 mg | ORAL_TABLET | Freq: Once | ORAL | Status: AC
  Administered 2019-08-01: 01:00:00 800 mg via ORAL
  Filled 2019-08-01: qty 2

## 2019-08-01 NOTE — ED Triage Notes (Signed)
Pt c/o right sided abdominal pain that now is just over her navel area, associated with n/v/d. Reporting she was seen last night at [facility] for the same, returning for vomiting and pain. Febrile in triage, no meds for the same recently

## 2019-08-01 NOTE — ED Provider Notes (Signed)
Nursing notes and vitals signs, including pulse oximetry, reviewed.  Summary of this visit's results, reviewed by myself:  EKG:  EKG Interpretation  Date/Time:    Ventricular Rate:    PR Interval:    QRS Duration:   QT Interval:    QTC Calculation:   R Axis:     Text Interpretation:         Labs:  Results for orders placed or performed during the hospital encounter of 07/31/19 (from the past 24 hour(s))  Lipase, blood     Status: None   Collection Time: 07/31/19 10:37 PM  Result Value Ref Range   Lipase 29 11 - 51 U/L  Comprehensive metabolic panel     Status: Abnormal   Collection Time: 07/31/19 10:37 PM  Result Value Ref Range   Sodium 138 135 - 145 mmol/L   Potassium 2.9 (L) 3.5 - 5.1 mmol/L   Chloride 102 98 - 111 mmol/L   CO2 28 22 - 32 mmol/L   Glucose, Bld 103 (H) 70 - 99 mg/dL   BUN 6 6 - 20 mg/dL   Creatinine, Ser 4.00 0.44 - 1.00 mg/dL   Calcium 9.4 8.9 - 86.7 mg/dL   Total Protein 9.3 (H) 6.5 - 8.1 g/dL   Albumin 4.4 3.5 - 5.0 g/dL   AST 16 15 - 41 U/L   ALT 18 0 - 44 U/L   Alkaline Phosphatase 43 38 - 126 U/L   Total Bilirubin 1.1 0.3 - 1.2 mg/dL   GFR calc non Af Amer >60 >60 mL/min   GFR calc Af Amer >60 >60 mL/min   Anion gap 8 5 - 15  CBC     Status: Abnormal   Collection Time: 07/31/19 10:37 PM  Result Value Ref Range   WBC 11.4 (H) 4.0 - 10.5 K/uL   RBC 3.54 (L) 3.87 - 5.11 MIL/uL   Hemoglobin 10.6 (L) 12.0 - 15.0 g/dL   HCT 61.9 (L) 50.9 - 32.6 %   MCV 87.9 80.0 - 100.0 fL   MCH 29.9 26.0 - 34.0 pg   MCHC 34.1 30.0 - 36.0 g/dL   RDW 71.2 45.8 - 09.9 %   Platelets 285 150 - 400 K/uL   nRBC 0.0 0.0 - 0.2 %  Urinalysis, Routine w reflex microscopic     Status: Abnormal   Collection Time: 07/31/19 10:37 PM  Result Value Ref Range   Color, Urine YELLOW YELLOW   APPearance HAZY (A) CLEAR   Specific Gravity, Urine 1.011 1.005 - 1.030   pH 6.0 5.0 - 8.0   Glucose, UA NEGATIVE NEGATIVE mg/dL   Hgb urine dipstick NEGATIVE NEGATIVE   Bilirubin Urine NEGATIVE NEGATIVE   Ketones, ur NEGATIVE NEGATIVE mg/dL   Protein, ur 833 (A) NEGATIVE mg/dL   Nitrite NEGATIVE NEGATIVE   Leukocytes,Ua TRACE (A) NEGATIVE   RBC / HPF 0-5 0 - 5 RBC/hpf   WBC, UA 11-20 0 - 5 WBC/hpf   Bacteria, UA RARE (A) NONE SEEN   Squamous Epithelial / LPF 6-10 0 - 5   Mucus PRESENT    Hyaline Casts, UA PRESENT   I-Stat beta hCG blood, ED     Status: None   Collection Time: 07/31/19 11:33 PM  Result Value Ref Range   I-stat hCG, quantitative <5.0 <5 mIU/mL   Comment 3            Imaging Studies: CT Renal Stone Study  Result Date: 08/01/2019 CLINICAL DATA:  Bilateral flank pain EXAM: CT  ABDOMEN AND PELVIS WITHOUT CONTRAST TECHNIQUE: Multidetector CT imaging of the abdomen and pelvis was performed following the standard protocol without IV contrast. COMPARISON:  None. FINDINGS: Lower chest: Lung bases are clear. No effusions. Heart is normal size. Hepatobiliary: Layering high-density material in the gallbladder could reflect stones or sludge. No focal hepatic abnormality. Pancreas: No focal abnormality or ductal dilatation. Spleen: No focal abnormality.  Normal size. Adrenals/Urinary Tract: No adrenal abnormality. No focal renal abnormality. No stones or hydronephrosis. Urinary bladder is unremarkable. Stomach/Bowel: Normal appendix. Stomach, large and small bowel grossly unremarkable. Vascular/Lymphatic: No evidence of aneurysm or adenopathy. Reproductive: Uterus and adnexa unremarkable.  No mass. Other: No free fluid or free air. Musculoskeletal: No acute bony abnormality. IMPRESSION: No renal or ureteral stones.  No hydronephrosis. Layering high-density material in the gallbladder could reflect stones or sludge. Normal appendix. No acute findings in the abdomen or pelvis. Electronically Signed   By: Rolm Baptise M.D.   On: 08/01/2019 01:56   2:26 AM Patient has minimal pain now.  Her abdomen is soft with mild epigastric tenderness.  Her CT is unremarkable  except for possible gallbladder sludge which does not correlate with the location of the patient's pain.  Given her recent history of vomiting and diarrhea this is likely a viral gastroenteritis.  Urine has been sent for culture.     Heather Morris, Heather Reichmann, MD 08/01/19 [phone]

## 2019-08-02 ENCOUNTER — Encounter (HOSPITAL_COMMUNITY): Payer: Self-pay | Admitting: Emergency Medicine

## 2019-08-02 ENCOUNTER — Emergency Department (HOSPITAL_COMMUNITY): Payer: Self-pay

## 2019-08-02 LAB — URINALYSIS, ROUTINE W REFLEX MICROSCOPIC
Bacteria, UA: NONE SEEN
Bilirubin Urine: NEGATIVE
Glucose, UA: NEGATIVE mg/dL
Ketones, ur: 20 mg/dL — AB
Leukocytes,Ua: NEGATIVE
Nitrite: NEGATIVE
Protein, ur: NEGATIVE mg/dL
Specific Gravity, Urine: >1.046 — ABNORMAL HIGH (ref 1.005–1.030)
pH: 6 (ref 5.0–8.0)

## 2019-08-02 LAB — CBC WITH DIFFERENTIAL/PLATELET
Abs Immature Granulocytes: 0.06 10*3/uL (ref 0.00–0.07)
Basophils Absolute: 0.1 10*3/uL (ref 0.0–0.1)
Basophils Relative: 0 %
Eosinophils Absolute: 0 10*3/uL (ref 0.0–0.5)
Eosinophils Relative: 0 %
HCT: 33.2 % — ABNORMAL LOW (ref 36.0–46.0)
Hemoglobin: 11.2 g/dL — ABNORMAL LOW (ref 12.0–15.0)
Immature Granulocytes: 0 %
Lymphocytes Relative: 17 %
Lymphs Abs: 2.6 10*3/uL (ref 0.7–4.0)
MCH: 29.1 pg (ref 26.0–34.0)
MCHC: 33.7 g/dL (ref 30.0–36.0)
MCV: 86.2 fL (ref 80.0–100.0)
Monocytes Absolute: 1.3 10*3/uL — ABNORMAL HIGH (ref 0.1–1.0)
Monocytes Relative: 9 %
Neutro Abs: 11.5 10*3/uL — ABNORMAL HIGH (ref 1.7–7.7)
Neutrophils Relative %: 74 %
Platelets: 333 10*3/uL (ref 150–400)
RBC: 3.85 MIL/uL — ABNORMAL LOW (ref 3.87–5.11)
RDW: 11.7 % (ref 11.5–15.5)
WBC: 15.7 10*3/uL — ABNORMAL HIGH (ref 4.0–10.5)
nRBC: 0 % (ref 0.0–0.2)

## 2019-08-02 LAB — I-STAT CHEM 8, ED
BUN: <3 mg/dL — ABNORMAL LOW (ref 6–20)
Calcium, Ion: 1.15 mmol/L (ref 1.15–1.40)
Chloride: 105 mmol/L (ref 98–111)
Creatinine, Ser: 0.7 mg/dL (ref 0.44–1.00)
Glucose, Bld: 92 mg/dL (ref 70–99)
HCT: 33 % — ABNORMAL LOW (ref 36.0–46.0)
Hemoglobin: 11.2 g/dL — ABNORMAL LOW (ref 12.0–15.0)
Potassium: 3.4 mmol/L — ABNORMAL LOW (ref 3.5–5.1)
Sodium: 139 mmol/L (ref 135–145)
TCO2: 23 mmol/L (ref 22–32)

## 2019-08-02 LAB — URINE CULTURE

## 2019-08-02 LAB — HEPATIC FUNCTION PANEL
ALT: 14 U/L (ref 0–44)
AST: 14 U/L — ABNORMAL LOW (ref 15–41)
Albumin: 3.4 g/dL — ABNORMAL LOW (ref 3.5–5.0)
Alkaline Phosphatase: 37 U/L — ABNORMAL LOW (ref 38–126)
Bilirubin, Direct: 0.3 mg/dL — ABNORMAL HIGH (ref 0.0–0.2)
Indirect Bilirubin: 0.9 mg/dL (ref 0.3–0.9)
Total Bilirubin: 1.2 mg/dL (ref 0.3–1.2)
Total Protein: 7.9 g/dL (ref 6.5–8.1)

## 2019-08-02 LAB — I-STAT BETA HCG BLOOD, ED (MC, WL, AP ONLY): I-stat hCG, quantitative: <5 m[IU]/mL (ref <5)

## 2019-08-02 LAB — RESPIRATORY PANEL BY RT PCR (FLU A&B, COVID)
Influenza A by PCR: NEGATIVE
Influenza B by PCR: NEGATIVE
SARS Coronavirus 2 by RT PCR: NEGATIVE

## 2019-08-02 MED ORDER — IOHEXOL 300 MG/ML  SOLN
100.0000 mL | Freq: Once | INTRAMUSCULAR | Status: AC | PRN
  Administered 2019-08-02: 100 mL via INTRAVENOUS

## 2019-08-02 MED ORDER — IBUPROFEN 800 MG PO TABS: 800.0000 mg | ORAL_TABLET | Freq: Three times a day (TID) | ORAL | 0 refills | Status: AC

## 2019-08-02 MED ORDER — SODIUM CHLORIDE 0.9 % IV SOLN
1.0000 g | Freq: Once | INTRAVENOUS | Status: AC
  Administered 2019-08-02: 1 g via INTRAVENOUS

## 2019-08-02 MED ORDER — SODIUM CHLORIDE 0.9 % IV BOLUS
500.0000 mL | Freq: Once | INTRAVENOUS | Status: AC
  Administered 2019-08-02: 500 mL via INTRAVENOUS

## 2019-08-02 MED ORDER — HALOPERIDOL LACTATE 5 MG/ML IJ SOLN
2.0000 mg | Freq: Once | INTRAMUSCULAR | Status: AC
  Administered 2019-08-02: 2 mg via INTRAVENOUS
  Filled 2019-08-02: qty 1

## 2019-08-02 MED ORDER — ALUM & MAG HYDROXIDE-SIMETH 200-200-20 MG/5ML PO SUSP
30.0000 mL | Freq: Once | ORAL | Status: AC
  Administered 2019-08-02: 30 mL via ORAL
  Filled 2019-08-02: qty 30

## 2019-08-02 MED ORDER — KETOROLAC TROMETHAMINE 15 MG/ML IJ SOLN
15.0000 mg | Freq: Once | INTRAMUSCULAR | Status: AC
  Administered 2019-08-02: 15 mg via INTRAVENOUS
  Filled 2019-08-02: qty 1

## 2019-08-02 MED ORDER — ACETAMINOPHEN 500 MG PO TABS
1000.0000 mg | ORAL_TABLET | Freq: Once | ORAL | Status: AC
  Administered 2019-08-02: 1000 mg via ORAL
  Filled 2019-08-02: qty 2

## 2019-08-02 MED ORDER — CEPHALEXIN 500 MG PO CAPS: 500.0000 mg | ORAL_CAPSULE | Freq: Four times a day (QID) | ORAL | 0 refills | Status: DC

## 2019-08-02 NOTE — ED Provider Notes (Signed)
[facility] EMERGENCY DEPARTMENT Provider Note   CSN: [id number] Arrival date & time: 08/01/19  2312     History Chief Complaint  Patient presents with  . Abdominal Pain    Heather Morris is a 24 y.o. female.  The history is provided by the patient.  Abdominal Pain Pain location:  Epigastric, LUQ and periumbilical Pain quality: dull   Pain radiates to:  Does not radiate Pain severity:  Severe Onset quality:  Gradual Duration:  10 days Timing:  Constant Progression:  Worsening Chronicity:  New Context: not eating, not recent sexual activity, not sick contacts, not suspicious food intake and not trauma   Relieved by:  Nothing Worsened by:  Nothing Ineffective treatments:  None tried (did not fill medication given at discharge yesterday) Associated symptoms: diarrhea, fever, nausea and vomiting   Associated symptoms: no anorexia, no belching, no chest pain, no chills, no constipation, no cough, no dysuria, no flatus, no shortness of breath, no sore throat, no vaginal bleeding and no vaginal discharge   Risk factors: no NSAID use        History reviewed. No pertinent past medical history.  Patient Active Problem List   Diagnosis Date Noted  . Pain in joint, ankle and foot 11/28/2013    History reviewed. No pertinent surgical history.   OB History   No obstetric history on file.     Family History  Problem Relation Age of Onset  . Hypertension Mother   . Hypertension Father     Social History   Tobacco Use  . Smoking status: Never Smoker  . Smokeless tobacco: Never Used  Substance Use Topics  . Alcohol use: No  . Drug use: Not on file    Home Medications Prior to Admission medications   Medication Sig Start Date End Date Taking? Authorizing Provider  ondansetron (ZOFRAN ODT) 8 MG disintegrating tablet Take 1 tablet (8 mg total) by mouth every 8 (eight) hours as needed for nausea or vomiting. 08/01/19   Molpus, Jonny Ruiz, MD     Allergies    Fish allergy  Review of Systems   Review of Systems  Constitutional: Positive for fever. Negative for chills.  HENT: Negative for sore throat.   Eyes: Negative for visual disturbance.  Respiratory: Negative for cough and shortness of breath.   Cardiovascular: Negative for chest pain, palpitations and leg swelling.  Gastrointestinal: Positive for abdominal pain, diarrhea, nausea and vomiting. Negative for anorexia, constipation and flatus.  Genitourinary: Negative for dysuria, vaginal bleeding and vaginal discharge.  Musculoskeletal: Negative for arthralgias.  Neurological: Negative for dizziness.  Psychiatric/Behavioral: Negative for agitation.  All other systems reviewed and are negative.   Physical Exam Updated Vital Signs BP 134/79 (BP Location: Right Arm)   Pulse (!) 124   Temp (!) 102.2 F (39 C) (Oral)   Resp 19   Ht 5\' 3"  (1.6 m)   Wt 90.7 kg   SpO2 99%   BMI 35.43 kg/m   Physical Exam Vitals and nursing note reviewed.  Constitutional:      General: She is not in acute distress.    Appearance: Normal appearance.  HENT:     Head: Normocephalic and atraumatic.     Nose: Nose normal.  Eyes:     Conjunctiva/sclera: Conjunctivae normal.     Pupils: Pupils are equal, round, and reactive to light.  Cardiovascular:     Rate and Rhythm: Normal rate and regular rhythm.     Pulses: Normal pulses.  Heart sounds: Normal heart sounds.  Pulmonary:     Effort: Pulmonary effort is normal.     Breath sounds: Normal breath sounds.  Abdominal:     General: Abdomen is flat. Bowel sounds are normal.     Tenderness: There is no abdominal tenderness. There is no guarding or rebound. Negative signs include Murphy's sign, McBurney's sign and psoas sign.     Hernia: No hernia is present.  Musculoskeletal:        General: Normal range of motion.     Cervical back: Normal range of motion and neck supple.  Skin:    General: Skin is warm and dry.     Capillary  Refill: Capillary refill takes less than 2 seconds.  Neurological:     General: No focal deficit present.     Mental Status: She is alert and oriented to person, place, and time.     Deep Tendon Reflexes: Reflexes normal.  Psychiatric:        Mood and Affect: Mood normal.        Behavior: Behavior normal.     ED Results / Procedures / Treatments   Labs (all labs ordered are listed, but only abnormal results are displayed) Results for orders placed or performed during the hospital encounter of 08/01/19  CBC with Differential/Platelet  Result Value Ref Range   WBC 15.7 (H) 4.0 - 10.5 K/uL   RBC 3.85 (L) 3.87 - 5.11 MIL/uL   Hemoglobin 11.2 (L) 12.0 - 15.0 g/dL   HCT 33.2 (L) 36.0 - 46.0 %   MCV 86.2 80.0 - 100.0 fL   MCH 29.1 26.0 - 34.0 pg   MCHC 33.7 30.0 - 36.0 g/dL   RDW 11.7 11.5 - 15.5 %   Platelets 333 150 - 400 K/uL   nRBC 0.0 0.0 - 0.2 %   Neutrophils Relative % 74 %   Neutro Abs 11.5 (H) 1.7 - 7.7 K/uL   Lymphocytes Relative 17 %   Lymphs Abs 2.6 0.7 - 4.0 K/uL   Monocytes Relative 9 %   Monocytes Absolute 1.3 (H) 0.1 - 1.0 K/uL   Eosinophils Relative 0 %   Eosinophils Absolute 0.0 0.0 - 0.5 K/uL   Basophils Relative 0 %   Basophils Absolute 0.1 0.0 - 0.1 K/uL   Immature Granulocytes 0 %   Abs Immature Granulocytes 0.06 0.00 - 0.07 K/uL  I-stat chem 8, ED (not at [facility] or [facility])  Result Value Ref Range   Sodium 139 135 - 145 mmol/L   Potassium 3.4 (L) 3.5 - 5.1 mmol/L   Chloride 105 98 - 111 mmol/L   BUN <3 (L) 6 - 20 mg/dL   Creatinine, Ser 0.70 0.44 - 1.00 mg/dL   Glucose, Bld 92 70 - 99 mg/dL   Calcium, Ion 1.15 1.15 - 1.40 mmol/L   TCO2 23 22 - 32 mmol/L   Hemoglobin 11.2 (L) 12.0 - 15.0 g/dL   HCT 33.0 (L) 36.0 - 46.0 %  I-Stat Beta hCG blood, ED ([facility], [facility], [facility] only)  Result Value Ref Range   I-stat hCG, quantitative <5.0 <5 mIU/mL   Comment 3           CT Renal Stone Study  Result Date: 08/01/2019 CLINICAL DATA:  Bilateral flank pain EXAM: CT ABDOMEN  AND PELVIS WITHOUT CONTRAST TECHNIQUE: Multidetector CT imaging of the abdomen and pelvis was performed following the standard protocol without IV contrast. COMPARISON:  None. FINDINGS: Lower chest: Lung bases are clear. No effusions.  Heart is normal size. Hepatobiliary: Layering high-density material in the gallbladder could reflect stones or sludge. No focal hepatic abnormality. Pancreas: No focal abnormality or ductal dilatation. Spleen: No focal abnormality.  Normal size. Adrenals/Urinary Tract: No adrenal abnormality. No focal renal abnormality. No stones or hydronephrosis. Urinary bladder is unremarkable. Stomach/Bowel: Normal appendix. Stomach, large and small bowel grossly unremarkable. Vascular/Lymphatic: No evidence of aneurysm or adenopathy. Reproductive: Uterus and adnexa unremarkable.  No mass. Other: No free fluid or free air. Musculoskeletal: No acute bony abnormality. IMPRESSION: No renal or ureteral stones.  No hydronephrosis. Layering high-density material in the gallbladder could reflect stones or sludge. Normal appendix. No acute findings in the abdomen or pelvis. Electronically Signed   By: Charlett Nose M.D.   On: 08/01/2019 01:56    Radiology CT Renal Stone Study  Result Date: 08/01/2019 CLINICAL DATA:  Bilateral flank pain EXAM: CT ABDOMEN AND PELVIS WITHOUT CONTRAST TECHNIQUE: Multidetector CT imaging of the abdomen and pelvis was performed following the standard protocol without IV contrast. COMPARISON:  None. FINDINGS: Lower chest: Lung bases are clear. No effusions. Heart is normal size. Hepatobiliary: Layering high-density material in the gallbladder could reflect stones or sludge. No focal hepatic abnormality. Pancreas: No focal abnormality or ductal dilatation. Spleen: No focal abnormality.  Normal size. Adrenals/Urinary Tract: No adrenal abnormality. No focal renal abnormality. No stones or hydronephrosis. Urinary bladder is unremarkable. Stomach/Bowel: Normal appendix. Stomach,  large and small bowel grossly unremarkable. Vascular/Lymphatic: No evidence of aneurysm or adenopathy. Reproductive: Uterus and adnexa unremarkable.  No mass. Other: No free fluid or free air. Musculoskeletal: No acute bony abnormality. IMPRESSION: No renal or ureteral stones.  No hydronephrosis. Layering high-density material in the gallbladder could reflect stones or sludge. Normal appendix. No acute findings in the abdomen or pelvis. Electronically Signed   By: Charlett Nose M.D.   On: 08/01/2019 01:56    Procedures Procedures (including critical care time)  Medications Ordered in ED Medications  cefTRIAXone (ROCEPHIN) 1 g in sodium chloride 0.9 % 100 mL IVPB (1 g Intravenous New Bag/Given 08/02/19 0342)  acetaminophen (TYLENOL) tablet 1,000 mg (1,000 mg Oral Given 08/02/19 0154)  haloperidol lactate (HALDOL) injection 2 mg (2 mg Intravenous Given 08/02/19 0154)  sodium chloride 0.9 % bolus 500 mL (0 mLs Intravenous Stopped 08/02/19 0312)  ketorolac (TORADOL) 15 MG/ML injection 15 mg (15 mg Intravenous Given 08/02/19 0154)  alum & mag hydroxide-simeth (MAALOX/MYLANTA) 200-200-20 MG/5ML suspension 30 mL (30 mLs Oral Given 08/02/19 0156)  iohexol (OMNIPAQUE) 300 MG/ML solution 100 mL (100 mLs Intravenous Contrast Given 08/02/19 0225)  sodium chloride 0.9 % bolus 500 mL (500 mLs Intravenous New Bag/Given 08/02/19 0336)    ED Course  I have reviewed the triage vital signs and the nursing notes.  Pertinent labs & imaging results that were available during my care of the patient were reviewed by me and considered in my medical decision making (see chart for details).  Seen yesterday, did not fill any of the meds.  I will add covid testing and Gc/chlamydia.  Patient denies covid exposure.  She is well appearing,     Given CT will treat for pyelonephritis. One dose of IV abx given in the ED.   PO challenged in the ED successfully.  Strict return precautions given.   Lauran Frederic was evaluated in  Emergency Department on 08/02/2019 for the symptoms described in the history of present illness. She was evaluated in the context of the global COVID-19 pandemic, which necessitated consideration that the  patient might be at risk for infection with the SARS-CoV-2 virus that causes COVID-19. Institutional protocols and algorithms that pertain to the evaluation of patients at risk for COVID-19 are in a state of rapid change based on information released by regulatory bodies including the CDC and federal and state organizations. These policies and algorithms were followed during the patient's care in the ED.  Final Clinical Impression(s) / ED Diagnoses Return for weakness, numbness, changes in vision or speech, fevers >100.4 unrelieved by medication, shortness of breath, intractable vomiting, or diarrhea, abdominal pain, Inability to tolerate liquids or food, cough, altered mental status or any concerns. No signs of systemic illness or infection. The patient is nontoxic-appearing on exam and vital signs are within normal limits.   I have reviewed the triage vital signs and the nursing notes. Pertinent labs &imaging results that were available during my care of the patient were reviewed by me and considered in my medical decision making (see chart for details).  After history, exam, and medical workup I feel the patient has been appropriately medically screened and is safe for discharge home. Pertinent diagnoses were discussed with the patient. Patient was given return precautions   Julia Kulzer, MD 08/02/19 0347

## 2019-08-02 NOTE — ED Notes (Addendum)
Pt provided urine sample, urine culture sent to mainlab if needed   Pt also provided ice water per request

## 2019-08-02 NOTE — Discharge Instructions (Addendum)
Take all nausea medication and antibiotics.

## 2019-08-04 LAB — GC/CHLAMYDIA PROBE AMP (~~LOC~~) NOT AT ARMC
Chlamydia: NEGATIVE
Neisseria Gonorrhea: NEGATIVE

## 2019-08-26 ENCOUNTER — Telehealth: Payer: Self-pay | Admitting: Primary Care

## 2019-08-26 DIAGNOSIS — Z862 Personal history of diseases of the blood and blood-forming organs and certain disorders involving the immune mechanism: Secondary | ICD-10-CM

## 2019-08-26 NOTE — Telephone Encounter (Signed)
Spoke to this patient.    She states that at the recommendation of her nutritionist she would also like labs placed for the following:    Transferrin and Iron    She states she plans to go to the lab tomorrow to get this completed if PCP could please have orders placed before then.  Notified patient that PCP will do their best to get these labs ordered, but that cannot make any guarantee that they will be placed by then.    CB [phone]

## 2019-08-26 NOTE — Addendum Note (Signed)
Addended by: Jeanmarie Plant on: 08/26/2019 01:49 PM     Modules accepted: Orders

## 2019-08-26 NOTE — Telephone Encounter (Signed)
Hmm.  Not sure about "nutritionist," nothing in epic, no notes, care everywhere etc.    To screen for anemia, I usually order a CBC and ferritin.  These orders are placed (and were previously ordered on 06/18/19)  Decline orders requested as they are dependent on these first two labs being abnormal, to begin with.

## 2019-08-26 NOTE — Telephone Encounter (Signed)
Left vm with MD message. Informed patient to call with any questions or concerns.

## 2019-08-27 ENCOUNTER — Other Ambulatory Visit
Admission: RE | Admit: 2019-08-27 | Discharge: 2019-08-27 | Disposition: A | Discharge status: Home / Self Care | Payer: Medicaid Other | Source: Ambulatory Visit | Attending: Primary Care | Admitting: Primary Care

## 2019-08-27 DIAGNOSIS — Z862 Personal history of diseases of the blood and blood-forming organs and certain disorders involving the immune mechanism: Secondary | ICD-10-CM | POA: Insufficient documentation

## 2019-08-27 DIAGNOSIS — R7303 Prediabetes: Secondary | ICD-10-CM | POA: Insufficient documentation

## 2019-08-27 DIAGNOSIS — R5383 Other fatigue: Secondary | ICD-10-CM | POA: Insufficient documentation

## 2019-08-27 LAB — HEMOGLOBIN A1C: Hemoglobin A1C: 5.6 %

## 2019-08-27 LAB — CBC AND DIFFERENTIAL
Baso # K/uL: 0 10*3/uL (ref 0.0–0.1)
Basophil %: 0.5 %
Eos # K/uL: 0.1 10*3/uL (ref 0.0–0.4)
Eosinophil %: 2.1 %
Hematocrit: 36 % (ref 34–45)
Hemoglobin: 10.5 g/dL — ABNORMAL LOW (ref 11.2–15.7)
IMM Granulocytes #: 0 10*3/uL (ref 0.0–0.0)
IMM Granulocytes: 0.2 %
Lymph # K/uL: 2.1 10*3/uL (ref 1.2–3.7)
Lymphocyte %: 34.1 %
MCH: 22 pg/cell — ABNORMAL LOW (ref 26–32)
MCHC: 30 g/dL — ABNORMAL LOW (ref 32–36)
MCV: 75 fL — ABNORMAL LOW (ref 79–95)
Mono # K/uL: 0.8 10*3/uL (ref 0.2–0.9)
Monocyte %: 13.5 %
Neut # K/uL: 3.1 10*3/uL (ref 1.6–6.1)
Nucl RBC # K/uL: 0 10*3/uL (ref 0.0–0.0)
Nucl RBC %: 0 /100 WBC (ref 0.0–0.2)
Platelets: 320 10*3/uL (ref 160–370)
RBC: 4.7 MIL/uL (ref 3.9–5.2)
RDW: 14.3 % (ref 11.7–14.4)
Seg Neut %: 49.6 %
WBC: 6.2 10*3/uL (ref 4.0–10.0)

## 2019-08-27 LAB — FERRITIN: Ferritin: 28 ng/mL (ref 10–120)

## 2019-08-28 NOTE — Result Encounter Note (Signed)
Released to pt through MyChart message.

## 2019-10-22 ENCOUNTER — Ambulatory Visit: Payer: MEDICAID | Attending: Internal Medicine

## 2019-10-22 DIAGNOSIS — Z20822 Contact with and (suspected) exposure to covid-19: Secondary | ICD-10-CM

## 2019-10-23 LAB — NOVEL CORONAVIRUS, NAA: SARS-CoV-2, NAA: NOT DETECTED

## 2019-10-23 LAB — SARS-COV-2, NAA 2 DAY TAT

## 2020-01-02 ENCOUNTER — Ambulatory Visit: Payer: Medicaid Other | Admitting: Primary Care

## 2020-01-02 DIAGNOSIS — J322 Chronic ethmoidal sinusitis: Secondary | ICD-10-CM

## 2020-01-02 MED ORDER — FLUTICASONE PROPIONATE 50 MCG/ACT NA SUSP *I*
1.0000 | Freq: Every day | NASAL | 2 refills | Status: DC
Start: 2020-01-02 — End: 2022-04-11

## 2020-01-02 NOTE — Progress Notes (Signed)
[facility]   Outpatient Progress Note    Subjective    1. Ethmoid sinusitis        Patient seen today for headaches.  Present over the past 4 days.  Centrally located just above the nose.  No fevers or chills.  Some congestion.  No one else sick around her.  Pain has been interfering with her concentration.  Not using any medication.  Does not think his allergies    ROS: as per HPI, and I personally reviewed the patients medication and problem list in Epic today immediatly prior and/or during our visit.    Objective    There were no vitals taken for this visit.     Physical Exam:    On exam she has audible congestion of the nasal passages, affecting her voice otherwise is well with good eye contact and normal work of breathing    Assessment & Plan:    1. Ethmoid sinusitis  Discussed starting Flonase for the next week.  Proper technique displayed.  If symptoms worsening over the next week discussed treating as bacterial sinusitis as it would be at least 10 days duration at that point  - fluticasone (FLONASE) 50 MCG/ACT nasal spray; Spray 1 spray into nostril daily  Dispense: 16 g; Refill: 2        RTC: prn       Jeanmarie Plant, MD  01/02/2020 at 7:45 AM  Family Medicine  [phone]

## 2020-05-19 ENCOUNTER — Ambulatory Visit: Payer: BLUE CROSS/BLUE SHIELD | Attending: Primary Care | Admitting: Primary Care

## 2020-05-19 VITALS — Ht 63.0 in | Wt 287.0 lb

## 2020-05-19 DIAGNOSIS — Z6841 Body Mass Index (BMI) 40.0 and over, adult: Secondary | ICD-10-CM

## 2020-05-19 DIAGNOSIS — R7303 Prediabetes: Secondary | ICD-10-CM | POA: Insufficient documentation

## 2020-05-19 NOTE — Progress Notes (Signed)
[facility]   Outpatient Progress Note    Subjective    1. BMI 50.0-59.9, adult    2. Prediabetes        "I've been thinking about bariatric surgery."  Has heard of surgical options    Has tried:   Hired a nutritionist   Gym pre-pandemic   [employer]   Intermittent fasting   Systems analyst.    PreDM  IBS-makes dieting hard    Discussed risks  longterm dietary changes.    No cig/THC  No snoring    ROS: as per HPI, and I personally reviewed the patients medication and problem list in Epic today immediatly prior and/or during our visit.    Objective    Height 1.6 m (5\' 3" ), weight 130.2 kg (287 lb).     Physical Exam:    At baseline    Wt Readings from Last 3 Encounters:   05/19/20 130.2 kg (287 lb)   09/30/18 118.4 kg (261 lb)   02/11/16 116.1 kg (256 lb) (>99 %, Z= 2.57)*     * Growth percentiles are based on CDC (Girls, 2-20 Years) data.       Assessment & Plan:    1. BMI 50.0-59.9, adult  2. Prediabetes  Recommended she do the [birth year]1994 to find out more.    covid booster discussed.      RTC: prn    Due to pandemic event, visit performed via:        Video   Location of Patient: home  Location of Telemedicine Provider: hospital / clinical location   Other participants in telemedicine encounter and roles: n/a    Consent was obtained from the patient to complete this video visit; including the potential for financial liability.    This visit was performed during a pandemic event and the vital signs including BMI and physical exam are limited or missing due to the patient's location.                 [place], MD  05/19/2020 at 4:43 PM  Family Medicine  [phone]

## 2020-06-23 ENCOUNTER — Encounter: Payer: Self-pay | Admitting: Primary Care

## 2020-06-23 ENCOUNTER — Ambulatory Visit: Payer: BLUE CROSS/BLUE SHIELD | Admitting: Primary Care

## 2020-06-23 VITALS — BP 126/62 | HR 84 | Temp 97.2°F | Ht 63.0 in | Wt 283.8 lb

## 2020-06-23 DIAGNOSIS — H9203 Otalgia, bilateral: Secondary | ICD-10-CM

## 2020-06-23 DIAGNOSIS — H6120 Impacted cerumen, unspecified ear: Secondary | ICD-10-CM

## 2020-06-23 NOTE — Progress Notes (Signed)
[facility]  [facility]    06/23/2020  Heather Callahan  [birth year], female, 24 y.o.     Subjective  CC:   Chief Complaint   Patient presents with    Otalgia       HPI:  She reports bilateral ear pain for 3d after exfoliating her external ear canal. Both ears, R.L. No URI type sxs.     Review of Systems   Constitutional: Negative for chills and fever.   HENT: Positive for ear pain. Negative for ear discharge, hearing loss, sore throat and tinnitus.    Respiratory: Negative for shortness of breath.        Current Outpatient Medications   Medication    fluticasone (FLONASE) 50 MCG/ACT nasal spray    ibuprofen (ADVIL,MOTRIN) 600 MG tablet    LORYNA 3-0.02 MG per tablet    polyethylene glycol (GLYCOLAX,MIRALAX) powder packet     No current facility-administered medications for this visit.         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       Past Surgical History:   Procedure Laterality Date    TONSILLECTOMY      TONSILLECTOMY         Social History     Socioeconomic History    Marital status: Single     Spouse name: Not on file    Number of children: Not on file    Years of education: Not on file    Highest education level: Not on file   Tobacco Use    Smoking status: Never Smoker    Smokeless tobacco: Never Used   Substance and Sexual Activity    Alcohol use: No    Drug use: No    Sexual activity: Never     Partners: Male   Other Topics Concern    Not on file   Social History Narrative    ** Merged History Encounter **         Lives with paternal grandmother since 55 months of age after parents divorced.  Good relationships with family. Four full siblings and seven half siblings. 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             Objective:  Vitals:     06/23/20 1054   Height: 1.6 m (5\' 3" )         Physical Exam  Constitutional:       General: She is not in acute distress.     Appearance: Normal appearance.   HENT:      Right Ear: Tympanic membrane normal.      Left Ear: Tympanic membrane normal.      Ears:      Comments: Bilateral canal cerumen noted, R>L, all cleared manually with a disposable curet.       Impression/Plan:    1. Bilateral ear pain due to excessive cerum -she was instructed to exfoliate her ears only 2-3 times per week.      MD FACP

## 2020-06-23 NOTE — Patient Instructions (Signed)
Only do exfoliation 3 times per week.

## 2020-07-08 IMAGING — CT CT ABD-PELV W/ CM
2 of 4 series · 15 of 46 positions shown, 17 images · IV contrast (omnipaque)
Comparison: Prior CT from 08/01/2019.

CLINICAL DATA: Initial evaluation for acute abdominal pain, fever,
nausea, vomiting.

EXAM:
CT ABDOMEN AND PELVIS WITH CONTRAST
TECHNIQUE: Multidetector CT imaging of the abdomen and pelvis was performed
using the standard protocol following bolus administration of
intravenous contrast.
CONTRAST:  100mL OMNIPAQUE IOHEXOL 300 MG/ML  SOLN

[Series 3: abdomen 5.0 · axial · 0.79mm/px · z∈[-479,-54]mm · 12 of 99 slices shown, 14 images]
[im 7/99  soft-tissue]
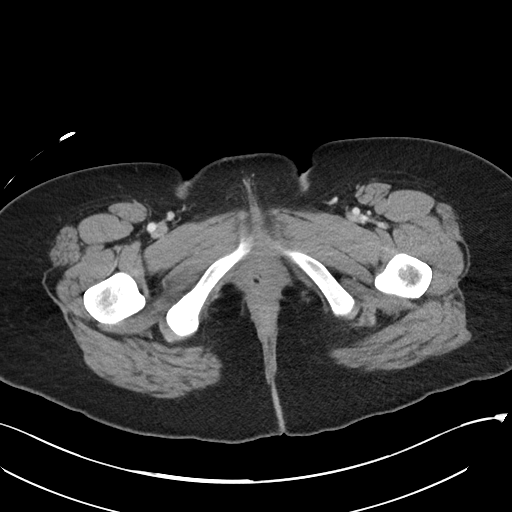
[im 7/99  bone]
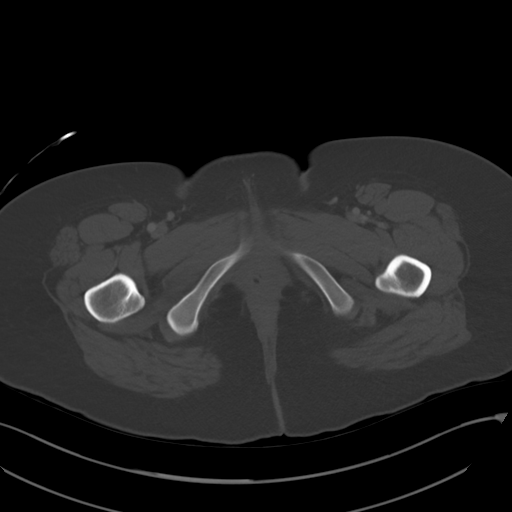
[im 14/99  soft-tissue]
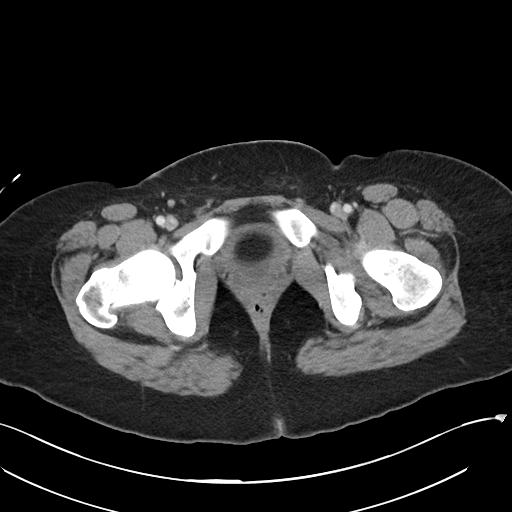
[im 20/99  soft-tissue]
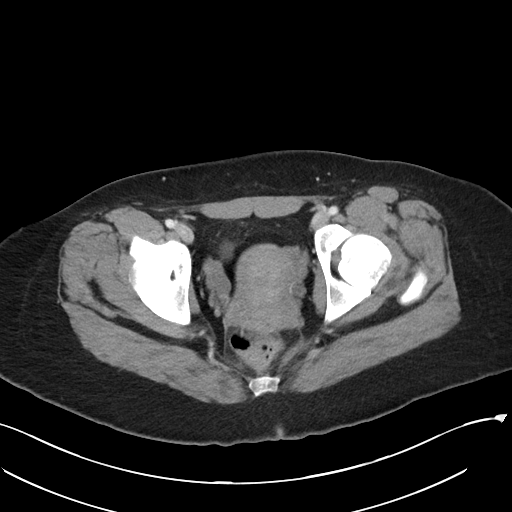
[im 33/99  soft-tissue]
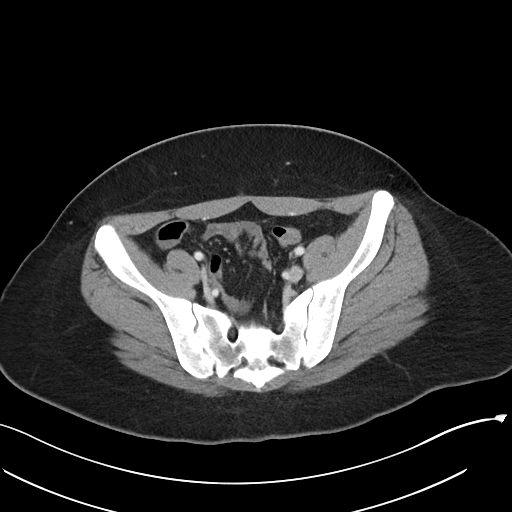
[im 40/99  soft-tissue]
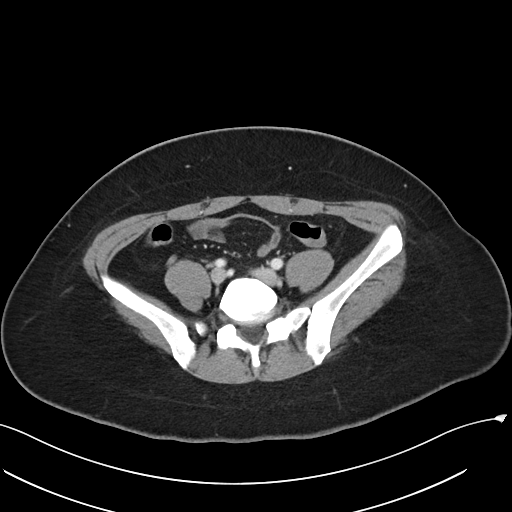
[im 46/99  soft-tissue]
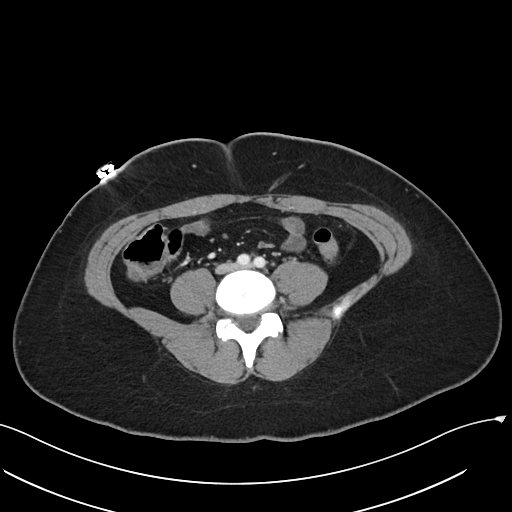
[im 53/99  soft-tissue]
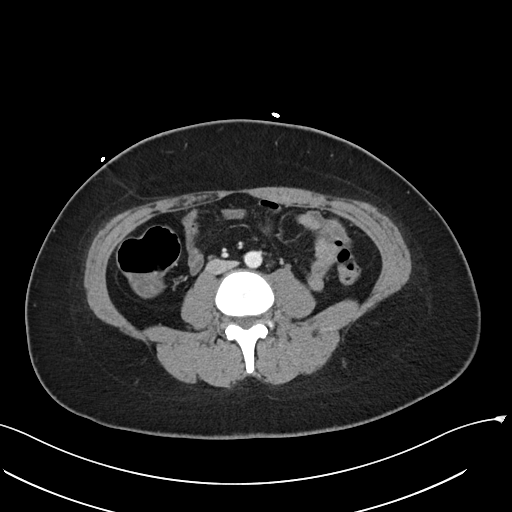
[im 59/99  soft-tissue]
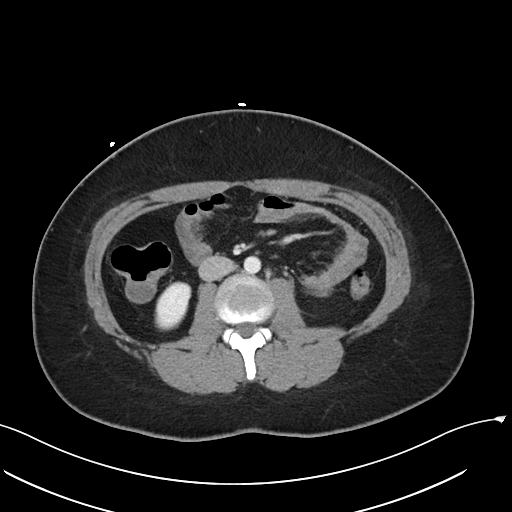
[im 66/99  soft-tissue]
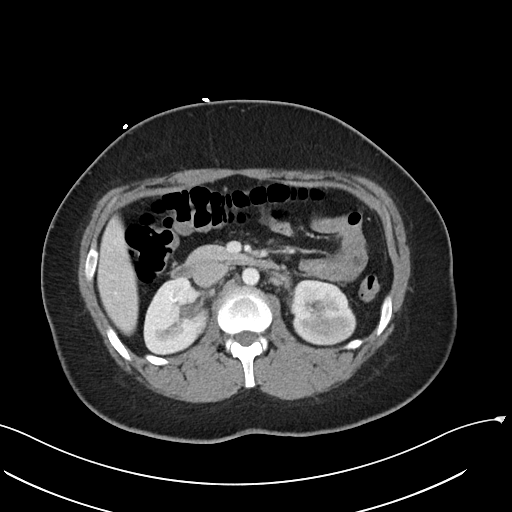
[im 66/99  bone]
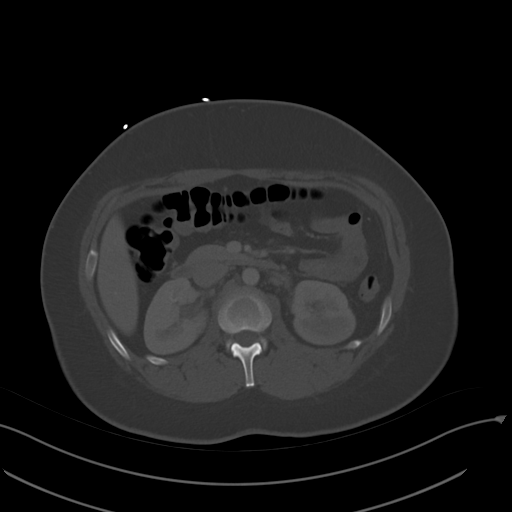
[im 79/99  soft-tissue]
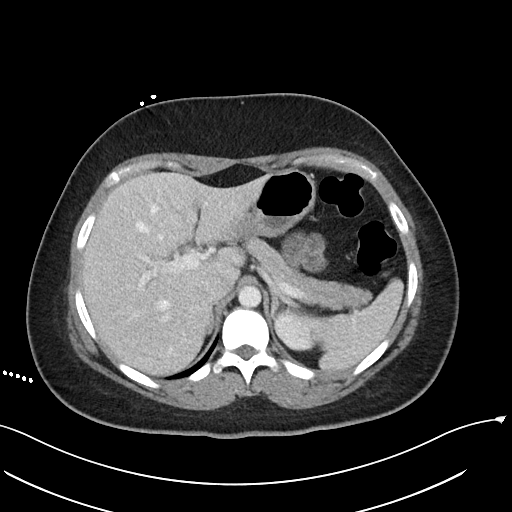
[im 85/99  soft-tissue]
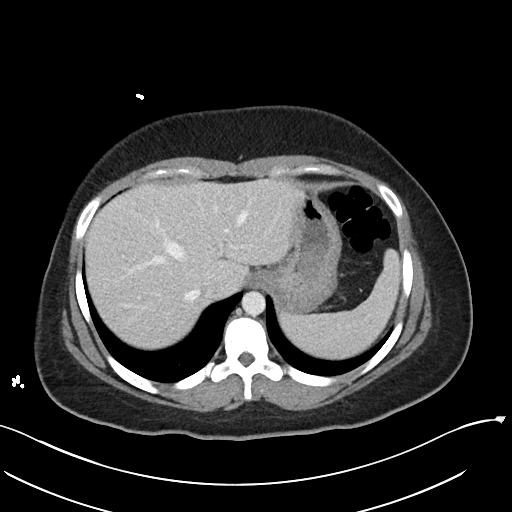
[im 92/99  soft-tissue]
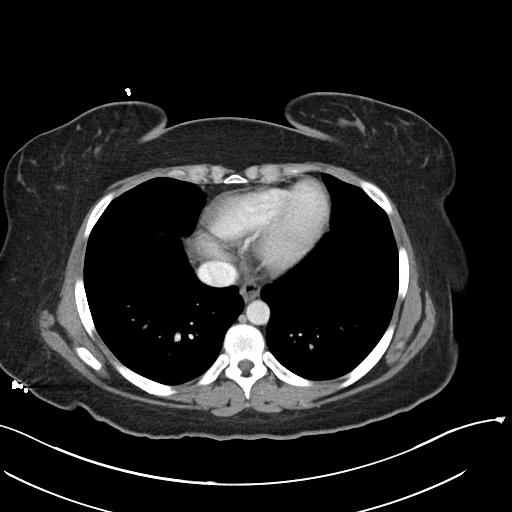

[Series 6: abdomen 3.0 mpr cor · coronal · 0.73mm/px · 3 of 97 slices shown]
[im 33/97  soft-tissue]
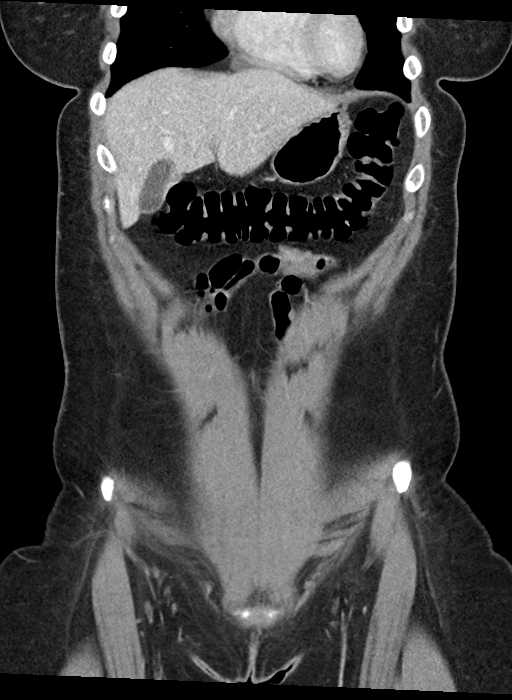
[im 43/97  soft-tissue]
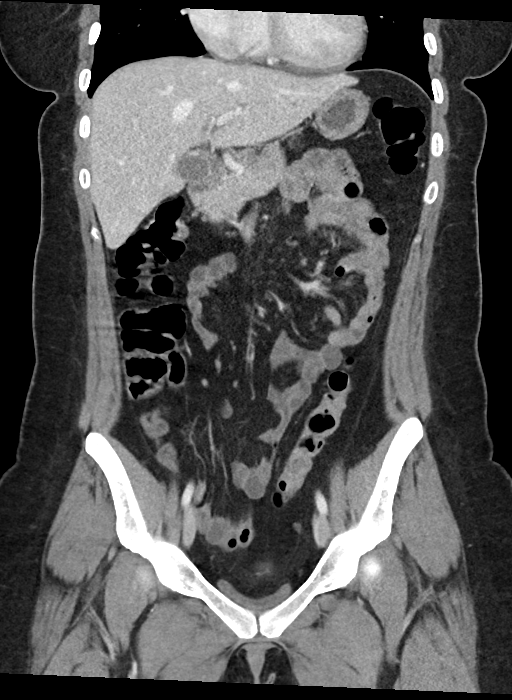
[im 54/97  soft-tissue]
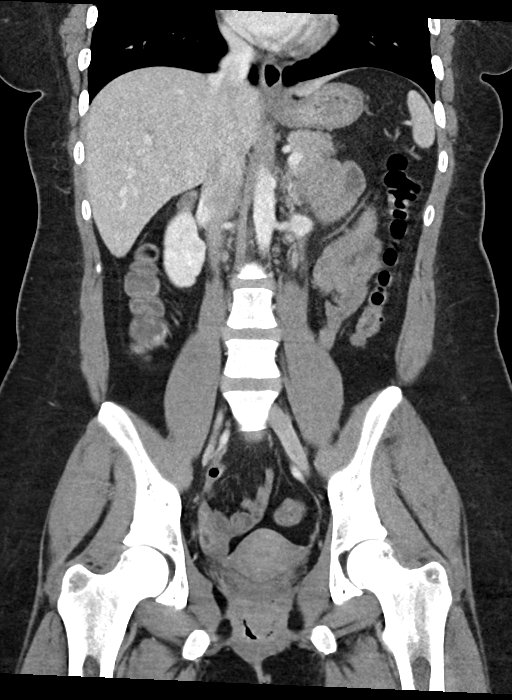

[15 of 46 positions shown; findings below may reference images not displayed]

FINDINGS: Lower chest: Visualized lung bases are clear.

Hepatobiliary: Liver demonstrates a normal contrast enhanced
appearance. Layering hyperdensity within the gallbladder lumen could
reflect stones and/or sludge, also seen on prior CT. No findings to
suggest acute cholecystitis. No biliary dilatation.

Pancreas: Pancreas within normal limits.

Spleen: Spleen within normal limits.

Adrenals/Urinary Tract: Adrenal glands are normal.

Kidneys fairly equal in size. There is subtle heterogeneity of the
renal parenchyma at the upper pole of the right kidney, suspicious
for possible early and/or mild infection/pyelonephritis. Question of
mild perinephric stranding about the left renal hilum, which can
also be seen with infection. No nephrolithiasis or hydronephrosis.
No focal renal mass. No hydroureter. Bladder decompressed without
acute abnormality.

Stomach/Bowel: Stomach within normal limits. No evidence for bowel
obstruction. Normal appendix. No acute inflammatory changes seen
about the bowels.

Vascular/Lymphatic: Normal intravascular enhancement seen throughout
the intra-abdominal aorta. Mesenteric vessels patent proximally. No
adenopathy.

Reproductive: Uterus and ovaries within normal limits.

Other: Small volume free fluid within the pelvis, presumably
physiologic. No free intraperitoneal air.

Musculoskeletal: No acute osseous abnormality. No discrete lytic or
blastic osseous lesions.
IMPRESSION: 1. Question subtle heterogeneous parenchymal enhancement with mild
perinephric fat stranding about the kidneys as above, which could
reflect sequelae of early and/or mild acute pyelonephritis.
Correlation with urinalysis, symptomatology, and laboratory values
recommended. No obstructive uropathy.
2. No other acute abnormality within the abdomen and pelvis.
3. Layering hyperdensity within the gallbladder lumen, which could
reflect stones and/or sludge. No findings to suggest acute
cholecystitis.

## 2021-03-31 ENCOUNTER — Ambulatory Visit: Payer: Medicaid Other | Admitting: Primary Care

## 2021-03-31 ENCOUNTER — Encounter: Payer: Self-pay | Admitting: Primary Care

## 2021-03-31 ENCOUNTER — Other Ambulatory Visit: Payer: Self-pay

## 2021-03-31 VITALS — BP 126/77 | HR 78 | Temp 97.5°F | Ht 63.0 in | Wt 275.6 lb

## 2021-03-31 DIAGNOSIS — Z23 Encounter for immunization: Secondary | ICD-10-CM

## 2021-03-31 DIAGNOSIS — M7672 Peroneal tendinitis, left leg: Secondary | ICD-10-CM

## 2021-03-31 NOTE — Progress Notes (Signed)
[facility]   Outpatient Progress Note    Subjective    1. Peroneal tendinitis, left leg    2. Immunization due        Patient presents today with sudden onset of left sided foot pain.  She started walking 10,000 steps per day about 2 months ago.  She did lose 8 pounds.  However she was doing this in 3 inch platform heels.  She woke up 1 day a little bit ago and noticed that the entire left side of her foot was very tender.  There is no arch pain, ankle pain or Achilles pain.    She is aware that she needs to recheck labs for prediabetes    Now working at [employer], finances    ROS: as per HPI, and I personally reviewed the patients medication and problem list in Epic today immediatly prior and/or during our visit.    Objective    Blood pressure 126/77, pulse 78, temperature 36.4 C (97.5 F), height 1.6 m (5\' 3" ), weight 125 kg (275 lb 9.6 oz), SpO2 97 %.     Physical Exam:    Foot is nonswollen.  No evidence of erythema or cellulitis  She is distinctly tender at the insertion of the peroneal tendon in the left foot, fifth metatarsal  Remainder of foot nontender    Assessment & Plan:    1. Peroneal tendinitis, left leg  Likely peroneal tendinitis given sudden uptick in steps per day in 3 inch platform heels  We discussed rest ice.  2 weeks of ibuprofen.  Rule out stress fracture with x-ray.  Gradual return to exercise.  Consider alternate activities that do not stress foot as much    - * Foot LEFT standard AP, Lateral, Oblique views; Future    2. Immunization due  - Flu vaccine quadrivalent greater than or equal to 58mo preservative free IM        RTC: For Pap smear    5mo, MD  03/31/2021 at 10:26 AM  Family Medicine  [phone]

## 2021-04-28 ENCOUNTER — Ambulatory Visit
Admission: RE | Admit: 2021-04-28 | Discharge: 2021-04-28 | Disposition: A | Discharge status: Home / Self Care | Payer: Medicaid Other | Source: Ambulatory Visit | Attending: Primary Care | Admitting: Primary Care

## 2021-04-28 ENCOUNTER — Other Ambulatory Visit: Payer: Self-pay

## 2021-04-28 DIAGNOSIS — M7672 Peroneal tendinitis, left leg: Secondary | ICD-10-CM | POA: Insufficient documentation

## 2021-04-28 NOTE — Result Encounter Note (Signed)
Released to pt through MyChart comment.

## 2021-06-29 ENCOUNTER — Other Ambulatory Visit: Payer: Self-pay

## 2021-06-29 ENCOUNTER — Ambulatory Visit: Payer: MEDICAID

## 2021-07-07 ENCOUNTER — Ambulatory Visit: Payer: MEDICAID

## 2021-07-20 ENCOUNTER — Ambulatory Visit: Payer: MEDICAID

## 2021-07-20 ENCOUNTER — Other Ambulatory Visit: Payer: Self-pay

## 2021-08-01 ENCOUNTER — Ambulatory Visit: Payer: MEDICAID

## 2021-08-01 ENCOUNTER — Other Ambulatory Visit: Payer: Self-pay

## 2021-08-08 ENCOUNTER — Other Ambulatory Visit: Payer: Self-pay

## 2021-08-08 ENCOUNTER — Ambulatory Visit: Payer: MEDICAID

## 2021-08-15 ENCOUNTER — Ambulatory Visit: Payer: MEDICAID

## 2021-08-15 ENCOUNTER — Other Ambulatory Visit: Payer: Self-pay

## 2021-08-22 ENCOUNTER — Ambulatory Visit: Payer: MEDICAID

## 2021-08-31 ENCOUNTER — Ambulatory Visit: Payer: MEDICAID

## 2021-08-31 ENCOUNTER — Other Ambulatory Visit: Payer: Self-pay

## 2021-09-01 ENCOUNTER — Ambulatory Visit: Payer: Self-pay | Admitting: Rehabilitative and Restorative Service Providers"

## 2021-09-01 DIAGNOSIS — M25532 Pain in left wrist: Secondary | ICD-10-CM

## 2021-09-02 NOTE — Progress Notes (Signed)
Pt seen for workplace health hand therapy specialist consult. Please see [employer] documentation system for details.

## 2021-09-05 ENCOUNTER — Other Ambulatory Visit: Payer: Self-pay

## 2021-09-05 ENCOUNTER — Ambulatory Visit: Payer: MEDICAID

## 2021-09-12 ENCOUNTER — Ambulatory Visit: Payer: MEDICAID

## 2021-09-18 ENCOUNTER — Ambulatory Visit (HOSPITAL_COMMUNITY)
Admission: EM | Admit: 2021-09-18 | Discharge: 2021-09-18 | Disposition: A | Discharge status: Home / Self Care | Payer: BC Managed Care – PPO | Attending: Nurse Practitioner | Admitting: Nurse Practitioner

## 2021-09-18 ENCOUNTER — Encounter (HOSPITAL_COMMUNITY): Payer: Self-pay | Admitting: Emergency Medicine

## 2021-09-18 DIAGNOSIS — Z20822 Contact with and (suspected) exposure to covid-19: Secondary | ICD-10-CM | POA: Diagnosis not present

## 2021-09-18 DIAGNOSIS — R059 Cough, unspecified: Secondary | ICD-10-CM | POA: Insufficient documentation

## 2021-09-18 DIAGNOSIS — J019 Acute sinusitis, unspecified: Secondary | ICD-10-CM | POA: Insufficient documentation

## 2021-09-18 MED ORDER — FLUTICASONE PROPIONATE 50 MCG/ACT NA SUSP: 2.0000 | Freq: Every day | NASAL | 0 refills | Status: AC

## 2021-09-18 MED ORDER — AMOXICILLIN-POT CLAVULANATE 875-125 MG PO TABS: 1.0000 | ORAL_TABLET | Freq: Two times a day (BID) | ORAL | 0 refills | Status: DC

## 2021-09-18 MED ORDER — BENZONATATE 100 MG PO CAPS: 100.0000 mg | ORAL_CAPSULE | Freq: Three times a day (TID) | ORAL | 0 refills | Status: AC | PRN

## 2021-09-18 NOTE — Discharge Instructions (Addendum)
Take medication as prescribed. ?You will be contacted if your COVID result is positive.  If you have access to MyChart, you will also be able to see the result there. ?Supportive care to include increasing fluids and getting plenty of rest. ?Follow-up if your symptoms worsen or do not improve. ?

## 2021-09-18 NOTE — ED Triage Notes (Signed)
Pt reports for 3 weeks had cough that is getting up "gross stuff". Throat sore from cough. Reports that today has new symptom of hard to swallow.  ?

## 2021-09-18 NOTE — ED Provider Notes (Signed)
?[place] ? ? ? ?CSN: [id number] ?Arrival date & time: 09/18/21  1450 ? ? ?  ? ?History   ?Chief Complaint ?Chief Complaint  ?Patient presents with  ? Cough  ? ? ?HPI ?Heather Morris is a 26 y.o. female.  ? ?The patient is a pleasant 26 year old female who presents with sinus symptoms and cough.  Symptoms have been present for the past 3 weeks.  Patient states her symptoms started off as allergy symptoms with nasal congestion, runny nose, and bilateral ear fullness.  She states over the past 3 weeks her symptoms have worsened with a consistent cough, sinus pressure, postnasal drainage, and continued runny nose and nasal congestion.  The cough has become productive of greenish sputum. She states that she has been taking over-the-counter Mucinex and Dimetapp with minimal relief.  She has received 2 COVID vaccines.  She has not received the flu vaccine.  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 ? ?The history is provided by the patient.  ? ?History reviewed. No pertinent past medical history. ? ?Patient Active Problem List  ? Diagnosis Date Noted  ? Pain in joint, ankle and foot 11/28/2013  ? ? ?History reviewed. No pertinent surgical history. ? ?OB History   ?No obstetric history on file. ?  ? ? ? ?Home Medications   ? ?Prior to Admission medications   ?Medication Sig Start Date End Date Taking? Authorizing Provider  ?amoxicillin-clavulanate (AUGMENTIN) 875-125 MG tablet Take 1 tablet by mouth every 12 (twelve) hours. 09/18/21  Yes Rueben Kassim-Warren, Alda Lea, NP  ?benzonatate (TESSALON PERLES) 100 MG capsule Take 1 capsule (100 mg total) by mouth every 8 (eight) hours as needed for up to 10 days for cough. 09/18/21 09/28/21 Yes Gardiner Espana-Warren, Alda Lea, NP  ?fluticasone (FLONASE) 50 MCG/ACT nasal spray Place 2 sprays into both nostrils daily for 14 days. 09/18/21 10/02/21 Yes Kamariah Fruchter-Warren, Alda Lea, NP  ?cephALEXin (KEFLEX) 500 MG capsule Take 1 capsule (500 mg total) by mouth 4 (four)  times daily. 08/02/19   Palumbo, April, MD  ?ibuprofen (ADVIL) 800 MG tablet Take 1 tablet (800 mg total) by mouth 3 (three) times daily. 08/02/19   Palumbo, April, MD  ?ondansetron (ZOFRAN ODT) 8 MG disintegrating tablet Take 1 tablet (8 mg total) by mouth every 8 (eight) hours as needed for nausea or vomiting. 08/01/19   Molpus, Jenny Reichmann, MD  ? ? ?Family History ?Family History  ?Problem Relation Age of Onset  ? Hypertension Mother   ? Hypertension Father   ? ? ?Social History ?Social History  ? ?Tobacco Use  ? Smoking status: Never  ? Smokeless tobacco: Never  ?Substance Use Topics  ? Alcohol use: No  ? ? ? ?Allergies   ?Fish allergy ? ? ?Review of Systems ?Review of Systems  ?Constitutional:  Positive for chills. Negative for fatigue and fever.  ?HENT:  Positive for congestion, postnasal drip, rhinorrhea, sinus pressure and sore throat.   ?Eyes: Negative.   ?Respiratory:  Positive for cough. Negative for shortness of breath and wheezing.   ?Cardiovascular: Negative.   ?Gastrointestinal: Negative.   ?Skin: Negative.   ?Psychiatric/Behavioral: Negative.    ? ? ?Physical Exam ?Triage Vital Signs ?ED Triage Vitals  ?Enc Vitals Group  ?   BP 09/18/21 1554 120/78  ?   Pulse Rate 09/18/21 1554 80  ?   Resp 09/18/21 1554 15  ?   Temp 09/18/21 1554 98.7 ?F (37.1 ?C)  ?   Temp Source 09/18/21 1554  Oral  ?   SpO2 09/18/21 1554 100 %  ?   Weight --   ?   Height --   ?   Head Circumference --   ?   Peak Flow --   ?   Pain Score 09/18/21 1553 0  ?   Pain Loc --   ?   Pain Edu? --   ?   Excl. in [place]? --   ? ?No data found. ? ?Updated Vital Signs ?BP 120/78 (BP Location: Left Arm)   Pulse 80   Temp 98.7 ?F (37.1 ?C) (Oral)   Resp 15   LMP 09/04/2021   SpO2 100%  ? ?Visual Acuity ?Right Eye Distance:   ?Left Eye Distance:   ?Bilateral Distance:   ? ?Right Eye Near:   ?Left Eye Near:    ?Bilateral Near:    ? ?Physical Exam ?Vitals reviewed.  ?Constitutional:   ?   General: She is not in acute distress. ?   Appearance: Normal  appearance. She is normal weight.  ?HENT:  ?   Head: Normocephalic and atraumatic.  ?   Right Ear: Ear canal and external ear normal. A middle ear effusion is present.  ?   Left Ear: Ear canal and external ear normal. A middle ear effusion is present.  ?   Nose: Mucosal edema, congestion and rhinorrhea present.  ?   Right Turbinates: Enlarged and swollen.  ?   Left Turbinates: Enlarged and swollen.  ?   Right Sinus: No maxillary sinus tenderness or frontal sinus tenderness.  ?   Left Sinus: No maxillary sinus tenderness or frontal sinus tenderness.  ?   Mouth/Throat:  ?   Lips: Pink.  ?   Pharynx: Uvula midline. Posterior oropharyngeal erythema present. No oropharyngeal exudate or uvula swelling.  ?   Tonsils: No tonsillar exudate.  ?Neurological:  ?   Mental Status: She is alert.  ? ? ? ?UC Treatments / Results  ?Labs ?(all labs ordered are listed, but only abnormal results are displayed) ?Labs Reviewed  ?SARS CORONAVIRUS 2 (TAT 6-24 HRS)  ? ? ?EKG ? ? ?Radiology ?No results found. ? ?Procedures ?Procedures (including critical care time) ? ?Medications Ordered in UC ?Medications - No data to display ? ?Initial Impression / Assessment and Plan / UC Course  ?I have reviewed the triage vital signs and the nursing notes. ? ?Pertinent labs & imaging results that were available during my care of the patient were reviewed by me and considered in my medical decision making (see chart for details). ? ?The patient is a 26 year old female who presents with allergy symptoms for the past 3 weeks.  She has had a cough since the onset of her symptoms, but the cough has worsened over the past 1 to 2 days.  She is also noting that the cough is also become productive.  She does have a history of seasonal allergies, but states that her symptoms have not improved even with the use of Mucinex and Dimetapp.  Based on her physical exam, there is concern that this is a bacterial infection based on the duration of her symptoms.  We will  prescribe Augmentin for 7 days, fluticasone for her nasal congestion, and Tessalon Perles for her cough.  Recommend continued supportive care to include increasing fluids and getting plenty of rest.  Patient advised that she will be contacted if her COVID test is positive.  Follow-up if symptoms do not improve. ? ?Final Clinical Impressions(s) / UC Diagnoses  ? ?  Final diagnoses:  ?Acute sinusitis with symptoms > 10 days  ? ? ? ?Discharge Instructions   ? ?  ?Take medication as prescribed. ?You will be contacted if your COVID result is positive.  If you have access to MyChart, you will also be able to see the result there. ?Supportive care to include increasing fluids and getting plenty of rest. ?Follow-up if your symptoms worsen or do not improve. ? ? ? ? ?ED Prescriptions   ? ? Medication Sig Dispense Auth. Provider  ? amoxicillin-clavulanate (AUGMENTIN) 875-125 MG tablet Take 1 tablet by mouth every 12 (twelve) hours. 14 tablet Srikar Chiang-Warren, Alda Lea, NP  ? fluticasone (FLONASE) 50 MCG/ACT nasal spray Place 2 sprays into both nostrils daily for 14 days. 16 g Myrth Dahan-Warren, Alda Lea, NP  ? benzonatate (TESSALON PERLES) 100 MG capsule Take 1 capsule (100 mg total) by mouth every 8 (eight) hours as needed for up to 10 days for cough. 30 capsule Rosalene Wardrop-Warren, Alda Lea, NP  ? ?  ? ?PDMP not reviewed this encounter. ?  ?Tish Men, NP ?09/18/21 1653 ? ?

## 2021-09-19 LAB — SARS CORONAVIRUS 2 (TAT 6-24 HRS): SARS Coronavirus 2: NEGATIVE

## 2021-12-07 ENCOUNTER — Ambulatory Visit: Payer: MEDICAID | Admitting: Registered"

## 2021-12-07 DIAGNOSIS — Z713 Dietary counseling and surveillance: Secondary | ICD-10-CM

## 2021-12-07 NOTE — Progress Notes (Signed)
Subjective Hx:   Heather Callahan is a 26 y.o. female who presents today for an initial visit for weight loss.    Today pt shared she would like RD help w/ weight loss. She shares that she has PCOS, and has gained approx 50-60lbs over a few year span of time.  Pt has tried a few diets but has had difficulty w/ long term sustainability. She notes that she has worked with a Data processing manager in the past and found it helpful, but had to d/c visits d/t lack of insurance coverage. Pt also shares that she has IBS, and experiences constipation  - w/ interventions (probiotic soda Poppi and supplement mixed in morning OJ) pt has increased BM frequency to 1x/every 2 days.    D/t pt scheduling conflict, initial appointment was abbreviated to 30 minutes.  Last weight   03/31/21 125 kg (275 lb 9.6 oz)   06/23/20 128.7 kg (283 lb 12.8 oz)     BMI Readings from Last 2 Encounters:   03/31/21 48.82 kg/m   06/23/20 50.27 kg/m         Personal and Medical Hx:   PMH significant for PCOS, IBS, anemia, prediabetes    Employment: Copywriter, advertising   Living Situation: w/ grandmother  Other Social Supports: other family nearby       24-Hour Dietary Recall (Intake Session):   The patient reports that she does the grocery shopping and cooking. Pt shops at [employer]: sausage biscuit w/ 10 timbits and coffee (3/7 days per week)  Muffin w/ coffee  Sometimes skips    Snack: denies  Lunch: salad (chicken salad w/ croutons and bacon bits and [place] dressing) ; water   Sometimes protein shake for lunch  Snack:   Dinner: 5:30 - 6pm - steak bites and potatoes   Burger  Stuffed bell pepper  Snack:     Pt enjoys: pancakes     Pt dislikes:     Beverages:  Water:   SSB: poppi soda   Alcohol:  Coffee: black coffee    {Food frequency of heart health related foods:36994:::1}      Supplementation/dose/duration:***    Restaurant/Takeout Meals: ***   Fast Food Meals: ***     Physical Activity:   Tries to get 10,000 steps/day - usually gets about 7,000 per  day     Objective (Labs and Vitals):   Writer reviewed most recent ***      No results for input(s): NA, K, CL, CO2, UN, CREAT, GFRC, GFRB, GLU, CA, TP, ALB, ALT, AST, ALK, TB in the last 8760 hours.    No results for input(s): WBC, HGB, HCT, RBC, PLT in the last 8760 hours.    No results for input(s): CHOL, HDL, LDLC, LDL, TRIG, NHDLC, CHHDC in the last 8760 hours.  Lab Results   Component Value Date    HA1C 5.6 08/27/2019     No results for input(s): VIDD2, VIDD3, VID25 in the last 8760 hours.         Nutrition Assessment:   Nutrition Diagnosis: {[facility] NUTRITION DIAGNOSES:[phone]}    Writer notes pt labs are concerning for: ***    Writer recommends checking *** for further nutrition assessment. Will discuss with ***.    {Nutrition Writer Recommends:36993:::1}    Energy Balance:  SB CALORIE CALCULATOR    Plan:   {[facility] NUTRITION [phone]}  - consider frozen meals for lunch  - kodiak cakes for breakfast  -   -  These interventions were discussed and developed with patient.  Materials Provided:   {[facility] NUTRITION MATERIALS:[phone]}      Follow-Up Plan for Monitoring and Evaluation:   Akeisha will follow-up in {NUMBERS 1-8:23345} {Time; units week/month/year w plurals:19499} by {DESC; FOLLOW UP TFTDDU:20254}.    Writer spent *** minutes with the patient.

## 2022-03-20 ENCOUNTER — Encounter (HOSPITAL_BASED_OUTPATIENT_CLINIC_OR_DEPARTMENT_OTHER): Payer: Self-pay

## 2022-03-20 ENCOUNTER — Other Ambulatory Visit: Payer: Self-pay

## 2022-03-20 ENCOUNTER — Emergency Department (HOSPITAL_BASED_OUTPATIENT_CLINIC_OR_DEPARTMENT_OTHER)
Admission: EM | Admit: 2022-03-20 | Discharge: 2022-03-20 | Disposition: A | Discharge status: Home / Self Care | Payer: BC Managed Care – PPO | Attending: Emergency Medicine | Admitting: Emergency Medicine

## 2022-03-20 DIAGNOSIS — R102 Pelvic and perineal pain: Secondary | ICD-10-CM | POA: Diagnosis present

## 2022-03-20 DIAGNOSIS — N760 Acute vaginitis: Secondary | ICD-10-CM | POA: Insufficient documentation

## 2022-03-20 DIAGNOSIS — N771 Vaginitis, vulvitis and vulvovaginitis in diseases classified elsewhere: Secondary | ICD-10-CM | POA: Diagnosis not present

## 2022-03-20 LAB — URINALYSIS, ROUTINE W REFLEX MICROSCOPIC
Bilirubin Urine: NEGATIVE
Glucose, UA: NEGATIVE mg/dL
Nitrite: NEGATIVE
Protein, ur: >300 mg/dL — AB
RBC / HPF: >50 RBC/hpf — ABNORMAL HIGH (ref 0–5)
Specific Gravity, Urine: 1.031 — ABNORMAL HIGH (ref 1.005–1.030)
pH: 6 (ref 5.0–8.0)

## 2022-03-20 LAB — PREGNANCY, URINE: Preg Test, Ur: NEGATIVE

## 2022-03-20 NOTE — ED Triage Notes (Signed)
Pt states since May, she has had irregular periods. Pt states that it started as two periods as month, but has now progressed to a constant stabbing sensation. Pt states there is not much bleeding, but just constant pain.

## 2022-03-20 NOTE — ED Provider Notes (Signed)
[facility] DE[facility] Provider Note   CSN: [id number] Arrival date & time: 03/20/22  1346     History  Chief Complaint  Patient presents with   Pelvic Pain    Heather Morris is a 26 y.o. female who presents to the emergency department with concerns for pelvic pain onset 2-3 days.  Also notes that she has had irregular periods since March.  No meds tried prior to arrival.  Patient does not have an OB/GYN at this time.  Denies urinary symptoms, vaginal discharge, fever, abdominal pain, nausea, vomiting.  Patient notes that she showers twice a day and intermittently aggressively washes her vaginal region.    The history is provided by the patient. No language interpreter was used.       Home Medications Prior to Admission medications   Medication Sig Start Date End Date Taking? Authorizing Provider  amoxicillin-clavulanate (AUGMENTIN) 875-125 MG tablet Take 1 tablet by mouth every 12 (twelve) hours. 09/18/21   Leath-Warren, Alda Lea, NP  cephALEXin (KEFLEX) 500 MG capsule Take 1 capsule (500 mg total) by mouth 4 (four) times daily. 08/02/19   Palumbo, April, MD  fluticasone (FLONASE) 50 MCG/ACT nasal spray Place 2 sprays into both nostrils daily for 14 days. 09/18/21 10/02/21  Leath-Warren, Alda Lea, NP  ibuprofen (ADVIL) 800 MG tablet Take 1 tablet (800 mg total) by mouth 3 (three) times daily. 08/02/19   Palumbo, April, MD  ondansetron (ZOFRAN ODT) 8 MG disintegrating tablet Take 1 tablet (8 mg total) by mouth every 8 (eight) hours as needed for nausea or vomiting. 08/01/19   Molpus, Jenny Reichmann, MD      Allergies    Fish allergy    Review of Systems   Review of Systems  Constitutional:  Negative for fever.  Gastrointestinal:  Negative for abdominal pain, nausea and vomiting.  Genitourinary:  Positive for pelvic pain and vaginal bleeding. Negative for dysuria, hematuria and vaginal discharge.  All other systems reviewed and are negative.   Physical Exam Updated  Vital Signs BP 113/85 (BP Location: Right Arm)   Pulse 97   Temp 98.1 F (36.7 C)   Resp 12   Ht 5\' 4"  (1.626 m)   Wt 90.7 kg   LMP 03/18/2022   SpO2 100%   BMI 34.33 kg/m  Physical Exam Vitals and nursing note reviewed. Exam conducted with a chaperone present.  Constitutional:      General: She is not in acute distress.    Appearance: Normal appearance. She is not ill-appearing, toxic-appearing or diaphoretic.  HENT:     Head: Normocephalic and atraumatic.     Right Ear: External ear normal.     Left Ear: External ear normal.  Eyes:     General: No scleral icterus.    Extraocular Movements: Extraocular movements intact.  Cardiovascular:     Rate and Rhythm: Normal rate and regular rhythm.     Pulses: Normal pulses.     Heart sounds: Normal heart sounds.  Pulmonary:     Effort: Pulmonary effort is normal. No respiratory distress.     Breath sounds: Normal breath sounds.  Abdominal:     General: Abdomen is flat. Bowel sounds are normal. There is no distension.     Palpations: Abdomen is soft. There is no mass.     Tenderness: There is no abdominal tenderness.     Hernia: There is no hernia in the left inguinal area or right inguinal area.  Genitourinary:    Pubic Area: No rash.  Labia:        Right: No rash, tenderness, lesion or injury.        Left: Tenderness present. No rash, lesion or injury.      Vagina: No signs of injury and foreign body. Bleeding present. No vaginal discharge, erythema or tenderness.     Cervix: Normal.     Uterus: Normal. Not deviated, not enlarged, not fixed and not tender.      Adnexa: Right adnexa normal and left adnexa normal.     Comments: NT chaperone present for exam.  Tenderness to palpation as well as swelling noted to the entire left labia minora.  No VS fluctuance noted.  Bleeding noted to vaginal vault.  Unable to complete entirety of exam as patient was uncomfortable during exam so exam discontinued at this time. Musculoskeletal:         General: Normal range of motion.     Cervical back: Normal range of motion and neck supple.  Lymphadenopathy:     Lower Body: No right inguinal adenopathy. No left inguinal adenopathy.  Skin:    General: Skin is warm and dry.  Neurological:     Mental Status: She is alert.     ED Results / Procedures / Treatments   Labs (all labs ordered are listed, but only abnormal results are displayed) Labs Reviewed  URINALYSIS, ROUTINE W REFLEX MICROSCOPIC - Abnormal; Notable for the following components:      Result Value   Color, Urine BROWN (*)    APPearance CLOUDY (*)    Specific Gravity, Urine 1.031 (*)    Hgb urine dipstick LARGE (*)    Ketones, ur TRACE (*)    Protein, ur >300 (*)    Leukocytes,Ua SMALL (*)    RBC / HPF >50 (*)    Bacteria, UA MANY (*)    All other components within normal limits  PREGNANCY, URINE    EKG None  Radiology No results found.  Procedures Procedures    Medications Ordered in ED Medications - No data to display  ED Course/ Medical Decision Making/ A&P                           Medical Decision Making Amount and/or Complexity of Data Reviewed Labs: ordered.   Pt presents with concerns for pelvic pain onset 2-3 days.  Notes that she showers twice a day after the gym and aggressively washes her GU area.  Patient afebrile.  On exam patient withNT chaperone present for exam.  Tenderness to palpation as well as swelling noted to the entire left labia minora.  No VS fluctuance noted.  Bleeding noted to vaginal vault.  Unable to complete entirety of exam as patient was uncomfortable during exam so exam discontinued at this time.  No acute cardiovascular respiratory or abdominal exam findings.  Differential diagnosis includes vulvovaginitis, Bartholin cyst, yeast infection, bacterial vaginosis.    Labs:  I ordered, and personally interpreted labs.  The pertinent results include:   Urinalysis notable for large amount of hemoglobin (patient  is currently on her menstrual cycle). Negative pregnancy urine    Disposition: Presentation suspicious for vulvovaginitis.  Doubt Bartholin cyst at this time, doubt yeast infection or bacterial vaginosis at this time. After consideration of the diagnostic results and the patients response to treatment, I feel that the patient would benefit from Discharge home.  Patient will be provided with on-call OB/GYN specialist for follow-up.  Supportive care measures and  strict return precautions discussed with patient at bedside. Pt acknowledges and verbalizes understanding. Pt appears safe for discharge. Follow up as indicated in discharge paperwork.    This chart was dictated using voice recognition software, Dragon. Despite the best efforts of this provider to proofread and correct errors, errors may still occur which can change documentation meaning.  Final Clinical Impression(s) / ED Diagnoses Final diagnoses:  Vaginitis and vulvovaginitis    Rx / DC Orders ED Discharge Orders     None         Willene Holian A, PA-C 03/20/22 Verneda Skill, MD 03/20/22 2332

## 2022-03-20 NOTE — Discharge Instructions (Signed)
It was a pleasure taking care of you today!  At home you may take 600 mg ibuprofen every 6 hours and alternate with 5 mg Tylenol every 6 hours for no more than 7 days.  You may place ice to the affected area 4 to 15 minutes at a time, ensure to place a barrier between your skin and the ice.  Attached is information for the OB/GYN specialist, call and set up a follow-up appointment regarding today's ED visit.  Do not take any bubble baths, try and avoid aggressive washing of the labia.  Return to the ED if you are experiencing increasing/worsening symptoms such as fever, drainage, worsening symptoms.

## 2022-03-21 ENCOUNTER — Other Ambulatory Visit: Payer: Self-pay

## 2022-03-21 ENCOUNTER — Encounter (HOSPITAL_BASED_OUTPATIENT_CLINIC_OR_DEPARTMENT_OTHER): Payer: Self-pay

## 2022-03-21 ENCOUNTER — Emergency Department (HOSPITAL_BASED_OUTPATIENT_CLINIC_OR_DEPARTMENT_OTHER)
Admission: EM | Admit: 2022-03-21 | Discharge: 2022-03-22 | Disposition: A | Discharge status: Home / Self Care | Payer: BC Managed Care – PPO | Attending: Emergency Medicine | Admitting: Emergency Medicine

## 2022-03-21 DIAGNOSIS — N7689 Other specified inflammation of vagina and vulva: Secondary | ICD-10-CM | POA: Diagnosis present

## 2022-03-21 DIAGNOSIS — N9489 Other specified conditions associated with female genital organs and menstrual cycle: Secondary | ICD-10-CM

## 2022-03-21 NOTE — ED Triage Notes (Signed)
Sts yesterday they attempted a pelvic exam but was too painful

## 2022-03-21 NOTE — ED Triage Notes (Signed)
POV, pt sts that she was here yesterday for same and pain is getting worse. Sts swelling and constant pain in vaginal area. Took ibuprofen approx 2115, tried warm bath and ice without any relief.

## 2022-03-22 MED ORDER — HYDROCODONE-ACETAMINOPHEN 5-325 MG PO TABS: 1.0000 | ORAL_TABLET | Freq: Four times a day (QID) | ORAL | 0 refills | Status: AC | PRN

## 2022-03-22 MED ORDER — HYDROCODONE-ACETAMINOPHEN 5-325 MG PO TABS
1.0000 | ORAL_TABLET | Freq: Once | ORAL | Status: AC
  Administered 2022-03-22: 1 via ORAL
  Filled 2022-03-22: qty 1

## 2022-03-22 NOTE — ED Provider Notes (Signed)
[facility] EMERGENCY DEPT Provider Note   CSN: [id number] Arrival date & time: 03/21/22  2130     History  Chief Complaint  Patient presents with   Groin Swelling    Heather Morris is a 26 y.o. female.  HPI     This is a 26 year old female who presents with persistent swelling of the labia.  Patient was seen and evaluated yesterday for the same.  Reports she has had worsening pain of the left labia.  She has noted swelling.  No drainage.  She has attempted sitz bath's and ice without significant relief.  Took ibuprofen prior to arrival.  Denies any new condoms but does state that she recently switched detergents.  However that was several weeks ago and she has since switched back.  Denies any vaginal discharge or concerns for STDs.  He is currently on her menstrual period  Home Medications Prior to Admission medications   Medication Sig Start Date End Date Taking? Authorizing Provider  HYDROcodone-acetaminophen (NORCO/VICODIN) 5-325 MG tablet Take 1 tablet by mouth every 6 (six) hours as needed. 03/22/22  Yes Nyra Anspaugh, Mayer Masker, MD  amoxicillin-clavulanate (AUGMENTIN) 875-125 MG tablet Take 1 tablet by mouth every 12 (twelve) hours. 09/18/21   Leath-Warren, Sadie Haber, NP  cephALEXin (KEFLEX) 500 MG capsule Take 1 capsule (500 mg total) by mouth 4 (four) times daily. 08/02/19   Palumbo, April, MD  fluticasone (FLONASE) 50 MCG/ACT nasal spray Place 2 sprays into both nostrils daily for 14 days. 09/18/21 10/02/21  Leath-Warren, Sadie Haber, NP  ibuprofen (ADVIL) 800 MG tablet Take 1 tablet (800 mg total) by mouth 3 (three) times daily. 08/02/19   Palumbo, April, MD  ondansetron (ZOFRAN ODT) 8 MG disintegrating tablet Take 1 tablet (8 mg total) by mouth every 8 (eight) hours as needed for nausea or vomiting. 08/01/19   Molpus, Jonny Ruiz, MD      Allergies    Fish allergy    Review of Systems   Review of Systems  Genitourinary:  Positive for vaginal bleeding and vaginal pain.  Negative for vaginal discharge.  All other systems reviewed and are negative.   Physical Exam Updated Vital Signs BP (!) 143/92   Pulse (!) 118   Temp 98.2 F (36.8 C)   Resp 18   Ht 1.626 m (5\' 4" )   Wt 90.7 kg   LMP 03/18/2022   SpO2 100%   BMI 34.32 kg/m  Physical Exam Vitals and nursing note reviewed.  Constitutional:      Appearance: She is well-developed. She is not ill-appearing.  HENT:     Head: Normocephalic and atraumatic.  Eyes:     Pupils: Pupils are equal, round, and reactive to light.  Cardiovascular:     Rate and Rhythm: Normal rate and regular rhythm.  Pulmonary:     Effort: Pulmonary effort is normal. No respiratory distress.  Abdominal:     Palpations: Abdomen is soft.  Genitourinary:    Comments: Focused external vaginal exam with pendulous swelling of the left labia minora, it is soft but tender, no fluctuance or induration, no obvious abscess, no erythema to suggest cellulitis, internal vaginal exam deferred Musculoskeletal:     Cervical back: Neck supple.  Skin:    General: Skin is warm and dry.  Neurological:     Mental Status: She is alert and oriented to person, place, and time.  Psychiatric:        Mood and Affect: Mood normal.     ED Results / Procedures /  Treatments   Labs (all labs ordered are listed, but only abnormal results are displayed) Labs Reviewed - No data to display  EKG None  Radiology No results found.  Procedures Procedures    Medications Ordered in ED Medications  HYDROcodone-acetaminophen (NORCO/VICODIN) 5-325 MG per tablet 1 tablet (has no administration in time range)    ED Course/ Medical Decision Making/ A&P                           Medical Decision Making Risk Prescription drug management.   This patient presents to the ED for concern of labial swelling and pain, this involves an extensive number of treatment options, and is a complaint that carries with it a high risk of complications and  morbidity.  I considered the following differential and admission for this acute, potentially life threatening condition.  The differential diagnosis includes Bartholin's abscess, contact dermatitis, mass, vulvovaginitis  MDM:    This is a 26 year old female who presents with persistent labial swelling and pain.  She is nontoxic and vital signs are reassuring.  GU exam with soft swelling of the left labia minora.  It is not fluctuant or erythematous.  Do not appreciate a drainable abscess although a developing Bartholin's gland abscess is a consideration.  Not consistent with cellulitis or Fournier's.  Do not see other vaginal irritation and could suggest vaginitis.  At this time, unclear etiology.  Recommend she follow-up closely with OB/GYN.  Continue sitz bath's.  She will be given pain medication.  Recommend cotton underwear and avoiding scents or detergents in her soaps.  (Labs, imaging, consults)  Labs: I Ordered, and personally interpreted labs.  The pertinent results include: None  Imaging Studies ordered: I ordered imaging studies including none I independently visualized and interpreted imaging. I agree with the radiologist interpretation  Additional history obtained from chart review.  External records from outside source obtained and reviewed including the ED evaluation  Cardiac Monitoring: The patient was maintained on a cardiac monitor.  I personally viewed and interpreted the cardiac monitored which showed an underlying rhythm of: Sinus rhythm  Reevaluation: After the interventions noted above, I reevaluated the patient and found that they have :stayed the same  Social Determinants of Health: Lives independently  Disposition: Discharge  Co morbidities that complicate the patient evaluation History reviewed. No pertinent past medical history.   Medicines Meds ordered this encounter  Medications   HYDROcodone-acetaminophen (NORCO/VICODIN) 5-325 MG per tablet 1 tablet    HYDROcodone-acetaminophen (NORCO/VICODIN) 5-325 MG tablet    Sig: Take 1 tablet by mouth every 6 (six) hours as needed.    Dispense:  6 tablet    Refill:  0    I have reviewed the patients home medicines and have made adjustments as needed  Problem List / ED Course: Problem List Items Addressed This Visit   None Visit Diagnoses     Labial swelling    -  Primary                   Final Clinical Impression(s) / ED Diagnoses Final diagnoses:  Labial swelling    Rx / DC Orders ED Discharge Orders          Ordered    HYDROcodone-acetaminophen (NORCO/VICODIN) 5-325 MG tablet  Every 6 hours PRN        03/22/22 0019              Merryl Hacker, MD 03/22/22 [phone]

## 2022-03-22 NOTE — Discharge Instructions (Signed)
You were seen today for labial swelling.  Her presentation is not consistent at this time is a Bartholin's abscess but may develop into pne.  Follow-up with GYN.  Call for an appointment.  Continue warm soaks at home.  Take pain medication as prescribed.

## 2022-03-23 ENCOUNTER — Ambulatory Visit: Payer: BC Managed Care – PPO | Admitting: Obstetrics and Gynecology

## 2022-03-23 ENCOUNTER — Other Ambulatory Visit (HOSPITAL_COMMUNITY)
Admission: RE | Admit: 2022-03-23 | Discharge: 2022-03-23 | Disposition: A | Discharge status: Home / Self Care | Payer: BC Managed Care – PPO | Source: Ambulatory Visit | Attending: Obstetrics and Gynecology | Admitting: Obstetrics and Gynecology

## 2022-03-23 ENCOUNTER — Other Ambulatory Visit: Payer: Self-pay

## 2022-03-23 ENCOUNTER — Encounter: Payer: Self-pay | Admitting: Obstetrics and Gynecology

## 2022-03-23 VITALS — BP 122/80 | HR 102 | Wt 192.5 lb

## 2022-03-23 DIAGNOSIS — N760 Acute vaginitis: Secondary | ICD-10-CM | POA: Diagnosis not present

## 2022-03-23 DIAGNOSIS — N9089 Other specified noninflammatory disorders of vulva and perineum: Secondary | ICD-10-CM

## 2022-03-23 DIAGNOSIS — R102 Pelvic and perineal pain: Secondary | ICD-10-CM | POA: Insufficient documentation

## 2022-03-23 DIAGNOSIS — N939 Abnormal uterine and vaginal bleeding, unspecified: Secondary | ICD-10-CM | POA: Diagnosis not present

## 2022-03-23 NOTE — Progress Notes (Signed)
Obstetrics and Gynecology New Patient Evaluation  Appointment Date: 03/23/2022  OBGYN Clinic: [facility]  Primary Care Provider: Patient, No Pcp Per  Referring Provider: [facility]  Chief Complaint: ED follow up  History of Present Illness: Heather Morris is a 26 y.o. African-American G0P0000 (Patient's last menstrual period was 03/18/2022.), seen for the above chief complaint. Her past medical history is significant for BMI 30s.  AUB: patient was having qmonth, regular periods, one week until about March of this year and she has two periods a month, with the second one lasting about a week.  LMP 9/3 and lasted for four days. Weight stable, no new meds  Vulvar pain: Has had right labia pain since 9/16. No prior history. Pt seen in ED but just given pain meds and UPT which was negative and no other labs done. S/s are better because she has been doing soaks at home  Patient states she has never been sexually active.   Review of Systems: Pertinent items noted in HPI and remainder of comprehensive ROS otherwise negative.    Past Medical History:  History reviewed. No pertinent past medical history.  Past Surgical History:  History reviewed. No pertinent surgical history.  Past Obstetrical History:  OB History  Gravida Para Term Preterm AB Living  0 0 0 0 0 0  SAB IAB Ectopic Multiple Live Births  0 0 0 0 0    Past Gynecological History: As per HPI.  Social History:  Social History   Socioeconomic History   Marital status: Single    Spouse name: Not on file   Number of children: Not on file   Years of education: Not on file   Highest education level: Not on file  Occupational History   Not on file  Tobacco Use   Smoking status: Never   Smokeless tobacco: Never  Substance and Sexual Activity   Alcohol use: No   Drug use: Not Currently   Sexual activity: Not Currently  Other Topics Concern   Not on file  Social History  Narrative   Not on file   Social Determinants of Health   Financial Resource Strain: Not on file  Food Insecurity: No Food Insecurity (03/23/2022)   Hunger Vital Sign    Worried About Running Out of Food in the Last Year: Never true    Ran Out of Food in the Last Year: Never true  Transportation Needs: No Transportation Needs (03/23/2022)   PRAPARE - Administrator, Civil Service (Medical): No    Lack of Transportation (Non-Medical): No  Physical Activity: Not on file  Stress: Not on file  Social Connections: Not on file  Intimate Partner Violence: Not on file    Family History:  Family History  Problem Relation Age of Onset   Hypertension Mother    Hypertension Father     Medications None  Allergies Fish allergy   Physical Exam:  BP 122/80   Pulse (!) 102   Wt 192 lb 8 oz (87.3 kg)   LMP 03/18/2022   BMI 33.04 kg/m  Body mass index is 33.04 kg/m. General appearance: Well nourished, well developed female in no acute distress.  Neck:  Supple, normal appearance, and no thyromegaly  Cardiovascular: normal s1 and s2.  No murmurs, rubs or gallops. Respiratory:  Clear to auscultation bilateral. Normal respiratory effort Abdomen: positive bowel sounds and no masses, hernias; diffusely non tender to palpation, non distended Neuro/Psych:  Normal mood and  affect.  Skin:  Warm and dry.  Lymphatic:  No inguinal lymphadenopathy.   Pelvic exam: is not limited by body habitus EGBUS: left labia minora has 1cm healing area that looks like a healing scrape, mildly ttp, non weeping, normal surrounding skin.  Patient declines speculum exam  Laboratory: none  Radiology: none  Assessment: pt stable  Plan:  1. Vaginal pain Pt okay with hsv swab. Since getting better, can hold off on presumptive valtrex. She does shave but doesn't endorse anything that she can remember that could've caused it - Cervicovaginal ancillary only( [place]) - Herpes simplex virus(hsv)  dna by pcr  2. Abnormal uterine bleeding (AUB) D/w her re: u/s vs cyclic progestin or OCPs. Patient to consider - TSH Rfx on Abnormal to Free T4 - CBC - Beta hCG quant (ref lab) - Herpes simplex virus(hsv) dna by pcr  3. Vulval lesion - Herpes simplex virus(hsv) dna by pcr  Orders Placed This Encounter  Procedures   TSH Rfx on Abnormal to Free T4   CBC   Beta hCG quant (ref lab)   Herpes simplex virus(hsv) dna by pcr   RTC PRN  Durene Romans MD Attending Center for [facility])

## 2022-03-24 ENCOUNTER — Encounter: Payer: Self-pay | Admitting: Primary Care

## 2022-03-24 ENCOUNTER — Other Ambulatory Visit: Payer: Self-pay

## 2022-03-24 ENCOUNTER — Ambulatory Visit: Payer: No Typology Code available for payment source | Admitting: Primary Care

## 2022-03-24 VITALS — BP 119/80 | HR 89 | Temp 97.3°F | Ht 65.8 in | Wt 289.2 lb

## 2022-03-24 DIAGNOSIS — N63 Unspecified lump in unspecified breast: Secondary | ICD-10-CM

## 2022-03-24 DIAGNOSIS — R7303 Prediabetes: Secondary | ICD-10-CM

## 2022-03-24 LAB — CBC
Hematocrit: 37.5 % (ref 34.0–46.6)
Hemoglobin: 12.9 g/dL (ref 11.1–15.9)
MCH: 29.7 pg (ref 26.6–33.0)
MCHC: 34.4 g/dL (ref 31.5–35.7)
MCV: 86 fL (ref 79–97)
Platelets: 332 10*3/uL (ref 150–450)
RBC: 4.34 x10E6/uL (ref 3.77–5.28)
RDW: 11.8 % (ref 11.7–15.4)
WBC: 7.2 10*3/uL (ref 3.4–10.8)

## 2022-03-24 LAB — TSH RFX ON ABNORMAL TO FREE T4: TSH: 1 u[IU]/mL (ref 0.450–4.500)

## 2022-03-24 LAB — BETA HCG QUANT (REF LAB): hCG Quant: <1 m[IU]/mL

## 2022-03-24 NOTE — Progress Notes (Signed)
[facility] - Outpatient Progress Note    SUBJECTIVE    Heather Callahan is a 26 y.o. female who presents for the following concerns:    CC: breast lump    Here with grandmother     2 days ago, noticed a lump under the left breast- tender.  It does drain white with some blood.    LMP was a few days ago.     FH of breast cancer in PGM and PGGM    Obesity/prediabetes: Pt requesting endo referral due to "hormonal imbalance"    Past Medical, Family and Social History  Patient's medications, allergies, past medical, surgical, social and family histories were updated and marked as reviewed, as appropriate.    Review of Systems   Constitutional: Negative for fever and chills.   Respiratory: Negative for shortness of breath.    Cardiovascular: Negative for chest pain.       OBJECTIVE    Blood pressure 119/80, pulse 89, temperature 36.3 C (97.3 F), height 1.671 m (5' 5.8"), weight 131.2 kg (289 lb 3.2 oz), SpO2 97 %.  VITALS: reviewed  GEN: Pleasant and cooperative in no apparent distress  Right breast with no masses/lesions  Left breast with firm, fluctuant, tender, 3 cm oval mass at 7 o'clock near ribcage, small opening appreciated    ASSESSMENT & PLAN    1. Mass of breast, unspecified laterality  Concern for abscess- recommend imaging and warm compresses. Continue to monitor and will consider abx if progressive pain, erythema, systemic symptoms. Would likely need surgical eval given location and risk of recurrence. FH of breast can.   - AMB REFERRAL [facility]  - US breast complete LEFT; Future    2. Prediabetes  Discussed that she should review with PCP to discuss "hormonal concerns"- will start with baseline labs to review with PCP in a few mos. Discussed that without a hormonal abnormality, a referral to endo isn't necessarily needed.   - Hemoglobin A1c; Future  - TSH; Future  - Comprehensive metabolic panel; Future  - Lipid Panel (Reflex to Direct  LDL if  Triglycerides more than 400); Future      Sindy Guadeloupe, M.D.  [facility]  [address], [address]  [place], [place] [address]  [phone]

## 2022-03-26 LAB — CERVICOVAGINAL ANCILLARY ONLY
Bacterial Vaginitis (gardnerella): POSITIVE — AB
Candida Glabrata: NEGATIVE
Candida Vaginitis: NEGATIVE
Chlamydia: NEGATIVE
Comment: NEGATIVE
Comment: NEGATIVE
Comment: NEGATIVE
Comment: NEGATIVE
Comment: NEGATIVE
Comment: NORMAL
Neisseria Gonorrhea: NEGATIVE
Trichomonas: NEGATIVE

## 2022-03-27 LAB — HSV DNA BY PCR (REFERENCE LAB)
HSV 2 DNA: NEGATIVE
HSV-1 DNA: NEGATIVE

## 2022-03-27 MED ORDER — METRONIDAZOLE 500 MG PO TABS: 500.0000 mg | ORAL_TABLET | Freq: Two times a day (BID) | ORAL | 0 refills | Status: AC

## 2022-03-27 NOTE — Addendum Note (Signed)
Addended by: Aletha Halim on: 03/27/2022 02:35 PM   Modules accepted: Orders

## 2022-03-29 ENCOUNTER — Ambulatory Visit
Admission: RE | Admit: 2022-03-29 | Discharge: 2022-03-29 | Disposition: A | Discharge status: Home / Self Care | Payer: No Typology Code available for payment source | Source: Ambulatory Visit | Attending: Primary Care | Admitting: Primary Care

## 2022-03-29 ENCOUNTER — Other Ambulatory Visit: Payer: Self-pay

## 2022-03-29 ENCOUNTER — Other Ambulatory Visit: Payer: Self-pay | Admitting: Primary Care

## 2022-03-29 DIAGNOSIS — N63 Unspecified lump in unspecified breast: Secondary | ICD-10-CM

## 2022-03-29 DIAGNOSIS — R7303 Prediabetes: Secondary | ICD-10-CM

## 2022-03-29 DIAGNOSIS — N6324 Unspecified lump in the left breast, lower inner quadrant: Secondary | ICD-10-CM

## 2022-03-30 ENCOUNTER — Telehealth: Payer: Self-pay | Admitting: Primary Care

## 2022-03-30 NOTE — Telephone Encounter (Signed)
-----   Message from Leodis Sias, MD sent at 03/29/2022  4:28 PM EDT -----  The area on the breast most consistent with a cyst- how is she feeling with it draining? Any redness/pain?

## 2022-03-30 NOTE — Telephone Encounter (Signed)
Called the patient and left VM asking her to call back for a message. Please relay information below. Also sent MyChart message.

## 2022-04-03 ENCOUNTER — Other Ambulatory Visit
Admission: RE | Admit: 2022-04-03 | Discharge: 2022-04-03 | Disposition: A | Discharge status: Home / Self Care | Payer: No Typology Code available for payment source | Source: Ambulatory Visit | Attending: Primary Care | Admitting: Primary Care

## 2022-04-03 DIAGNOSIS — Z6841 Body Mass Index (BMI) 40.0 and over, adult: Secondary | ICD-10-CM | POA: Insufficient documentation

## 2022-04-03 DIAGNOSIS — R7303 Prediabetes: Secondary | ICD-10-CM | POA: Insufficient documentation

## 2022-04-03 LAB — LIPID PANEL
Chol/HDL Ratio: 3.6
Cholesterol: 222 mg/dL — AB
HDL: 61 mg/dL — ABNORMAL HIGH (ref 40–60)
LDL Calculated: 131 mg/dL — AB
Non HDL Cholesterol: 161 mg/dL
Triglycerides: 149 mg/dL

## 2022-04-03 LAB — COMPREHENSIVE METABOLIC PANEL
ALT: 14 U/L (ref 0–35)
AST: 22 U/L (ref 0–35)
Albumin: 4.3 g/dL (ref 3.5–5.2)
Alk Phos: 93 U/L (ref 35–105)
Anion Gap: 12 (ref 7–16)
Bilirubin,Total: 0.4 mg/dL (ref 0.0–1.2)
CO2: 22 mmol/L (ref 20–28)
Calcium: 9.5 mg/dL (ref 8.8–10.2)
Chloride: 105 mmol/L (ref 96–108)
Creatinine: 0.82 mg/dL (ref 0.51–0.95)
Glucose: 104 mg/dL — ABNORMAL HIGH (ref 60–99)
Lab: 7 mg/dL (ref 6–20)
Potassium: 4.8 mmol/L (ref 3.3–5.1)
Sodium: 139 mmol/L (ref 133–145)
Total Protein: 7.7 g/dL (ref 6.3–7.7)
eGFR BY CREAT: 101 *

## 2022-04-03 LAB — TSH: TSH: 0.61 u[IU]/mL (ref 0.27–4.20)

## 2022-04-03 LAB — HEMOGLOBIN A1C: Hemoglobin A1C: 5.8 % — ABNORMAL HIGH

## 2022-04-03 LAB — MULTIPLE ORDERING DOCS

## 2022-04-03 NOTE — Telephone Encounter (Signed)
Received call from patient stating she received a call from nurse to give the office a call back.patient had questions and concerns to speak to the nurse.      Please advise

## 2022-04-03 NOTE — Result Encounter Note (Signed)
Released to pt through MyChart comment.

## 2022-04-03 NOTE — Telephone Encounter (Signed)
Spoke to patient she advised that still draining some.  Not painful or red.  Drainage is reducing.  Advised that she can also try to express the drainage in shower and also can do warm compress to help with drainage also.     Patient expressed understanding.

## 2022-04-11 ENCOUNTER — Encounter: Payer: Self-pay | Admitting: Surgery

## 2022-04-11 ENCOUNTER — Other Ambulatory Visit: Payer: Self-pay

## 2022-04-11 ENCOUNTER — Ambulatory Visit: Payer: No Typology Code available for payment source | Admitting: Surgery

## 2022-04-11 VITALS — BP 119/81 | HR 106 | Temp 98.2°F | Wt 295.1 lb

## 2022-04-11 DIAGNOSIS — N6089 Other benign mammary dysplasias of unspecified breast: Secondary | ICD-10-CM

## 2022-04-11 NOTE — H&P (Signed)
Breast Health Program      Heather Callahan  [id number]    Referring Physician: Jeanmarie Plant, MD     [address]  [place] [address]     [phone]    Chief Complaint: The patient is referred for consultation regarding management of a breast cyst.     HPI: Heather Callahan is a pleasant 26 y.o. Black female seen on 04/11/2022 for consultation at [facility]. Here with her Grandma.    Pt noticed a small lump on the left breast at the left inframammary fold and went to her PCP on 03/24/22. At that time, the lump was noted to be 3cm and draining.     She went for a Left Breast US on 03/24/22 this showed a left epidermal inclusion cyst.     The cyst has been draining well and is almost gone. She has been taking frequent warm showers. Has not received any antibiotic therapy for this cyst.    Her relevant breast imaging/pathology history is as follows:  Left Breast Ultrasound 03/29/22:  There is no sonographic evidence of malignancy. Benign-appearing likely epidermal inclusion cyst with mild hyperemia and skin thickening suggestive of a mild infectious component.      The patient reports spontaneous drainage and a decrease in size (from a "quarter to a dime" size).      Recommend clinical follow-up. Routine screening at age 43 or tailored to this patient's risk factors.      A return to routine screening mammogram at age 52 is recommended.      The patient was made aware of the above findings and recommendations immediately after the exam.      BI-RADS Category 2: Benign Finding(s)          Breast Density:  unknown     Risk Assessment:         Past Breast History:   She denies any prior breast masses, nipple discharge, skin changes or significant breast pain. She denies any prior breast biopsies or other breast surgery.       Current Lifestyle:  Alcohol: 0 drinks/week  Coffee: 1-2 cups/day  Sleep: 8 hours/day  Exercise: No   Diet: bacon, egg, chicken tenders,  description of typical  day    Medical History:   Past Medical History:   Diagnosis Date    Functional constipation     GERD (gastroesophageal reflux disease)     IBS (irritable bowel syndrome)     Low TSH level     Maternal hypothyroidism 12/28/2014         Medications:   Current Outpatient Medications   Medication Sig Note    ibuprofen (ADVIL,MOTRIN) 600 MG tablet Take 1 tablet (600 mg total) by mouth 3 times daily as needed     LORYNA 3-0.02 MG per tablet 1 BY MOUTH EVERY DAY 08/18/2015: Received from: External Pharmacy    polyethylene glycol (GLYCOLAX,MIRALAX) powder packet 17 g daily as needed      No current facility-administered medications for this visit.       Allergies: has No Known Allergies (drug, envir, food or latex).    Surgical history:   Past Surgical History:   Procedure Laterality Date    TONSILLECTOMY      TONSILLECTOMY         Social:   Social History     Socioeconomic History    Marital status: Single   Tobacco Use    Smoking status:  Never    Smokeless tobacco: Never   Substance and Sexual Activity    Alcohol use: No    Drug use: No    Sexual activity: Never     Partners: Male   Social History Narrative    ** Merged History Encounter **         Lives with paternal grandmother since 58 months of age after parents divorced.  Good relationships with family. Four full siblings and seven half siblings. 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               Problem List:    Patient Active Problem List   Diagnosis Code    Scoliosis M41.9    BMI 50.0-59.9, adult Z68.43    Irritable bowel syndrome K58.9    Acne vulgaris L70.0    Prediabetes R73.03       Family History: Cancer-related family history includes Breast cancer (age of onset: 27) in her paternal grandmother.    OB History:  OB History   Obstetric Comments   G 0  P 0   Menarche: 14   Menopause:premenopausal   LMP: No LMP recorded. (Menstrual status: Polycystic Ovary Syndrome).   Age at 57st Birth: Na   Breast feeding: No   OCPs: pills   Fertility Treatment:  No   HRT: No   Ashkenazi Jewish: No   Prior breast biopsy: No        Review of Systems: Comprehensive ROS was taken on the patient intake form and reviewed with the patient. Pertinent items are noted in HPI.      Physical Exam:  Vitals: BP 119/81   Pulse 106   Temp 36.8 C (98.2 F) (Temporal)   Wt 133.8 kg (295 lb 1.4 oz)   SpO2 98%   BMI 47.92 kg/m    General appearance: alert, appears stated age, and cooperative  Head: Normocephalic, without obvious abnormality, atraumatic  Eyes: conjunctivae/corneas clear. PERRL, EOM's intact.  Ears: External ears are normal, hearing is grossly intact.  Neck: no adenopathy and supple, symmetrical, trachea midline  Extremities: extremities normal, atraumatic, no cyanosis or edema  Pulses: 2+ and symmetric  Skin: Skin color, texture, turgor normal. No rashes or lesions  Lymph nodes: Cervical, supraclavicular, and axillary nodes normal.  Neurologic: Grossly normal  Breasts: Comprehensive breast examination was performed in the seated and supine positions. The breasts are of medium size and moderately ptotic/pendulous. The breasts are symmetric with normal shape and contour. The skin is without erythema, edema, nodules or other lesions. Nipples are normal and everted.There are no skin changes on arm maneuvers. On palpation there are no masses palpable in either breast. No discharge is expressible. Remnants of the Epidermal inclusion cyst felt in the left IMF (~26mm). Unable to express any fluid.      Impression/Plan:      Left epidermal inclusion cyst:  - mostly resolved  - continue warm showers and expression of fluid as needed  - If future inclusion cysts form in the same areas, can consider surgical excision    2. Increased risk for breast cancer   - Screening mammogram recommended starting at age 80    Follow up PRN.    Hulda Humphrey, PA    I saw and evaluated this patient with Elvera Maria, PA. The above note is accurate and reflects my input.     Carmela Rima, NP

## 2022-06-05 ENCOUNTER — Other Ambulatory Visit
Admission: RE | Admit: 2022-06-05 | Discharge: 2022-06-05 | Disposition: A | Discharge status: Home / Self Care | Payer: No Typology Code available for payment source | Source: Ambulatory Visit | Attending: Physician Assistant | Admitting: Physician Assistant

## 2022-06-05 DIAGNOSIS — Z124 Encounter for screening for malignant neoplasm of cervix: Secondary | ICD-10-CM | POA: Insufficient documentation

## 2022-06-07 ENCOUNTER — Ambulatory Visit: Payer: No Typology Code available for payment source | Admitting: Primary Care

## 2022-06-07 ENCOUNTER — Encounter: Payer: Self-pay | Admitting: Primary Care

## 2022-06-07 ENCOUNTER — Other Ambulatory Visit: Payer: Self-pay

## 2022-06-07 VITALS — BP 118/70 | Temp 97.8°F | Ht 65.8 in | Wt 290.0 lb

## 2022-06-07 DIAGNOSIS — L700 Acne vulgaris: Secondary | ICD-10-CM

## 2022-06-07 DIAGNOSIS — Z0000 Encounter for general adult medical examination without abnormal findings: Secondary | ICD-10-CM

## 2022-06-07 DIAGNOSIS — R7303 Prediabetes: Secondary | ICD-10-CM

## 2022-06-07 DIAGNOSIS — Z6841 Body Mass Index (BMI) 40.0 and over, adult: Secondary | ICD-10-CM

## 2022-06-07 NOTE — Progress Notes (Signed)
HPI/PAST MEDICAL/SURGICAL/GYN HISTORY:    HPI (recent events) and patient concerns:     Acne-  "I want to have glass skin"  Less dark spots/hyperpigmentation    Bariatric surgery?  Wt Readings from Last 3 Encounters:   06/07/22 131.5 kg (290 lb)   04/11/22 133.8 kg (295 lb 1.4 oz)   03/24/22 131.2 kg (289 lb 3.2 oz)           Social History:   reports that she has never smoked. She has never used smokeless tobacco. She reports that she does not drink alcohol, does not use drugs, and does not engage in sexual activity.  --smoking cessation needed?  None    Sexual History:  Contraceptive plan: OCPs    Menstrual history:  -dysmenorrhea:  No  -intermenstrual spotting? no  -postmenopausal bleeding?  N/A  -STD history or concerns:  Declined    Pap history:  03/2018-sat  06/2022-     Safety:  --wears seatbelt sometimes  --wears cycling helmet not applicable  --smoke detectors present Yes  --dental care:  2023-  --eye care:  pre covid  --history of domestic violence no    Vitals:    06/07/22 1031   BP: 118/70   Pulse: (P) 93   Temp: 36.6 C (97.8 F)   Weight: 131.5 kg (290 lb)   Height: 1.671 m (5' 5.8")       Wt Readings from Last 3 Encounters:   06/07/22 131.5 kg (290 lb)   04/11/22 133.8 kg (295 lb 1.4 oz)   03/24/22 131.2 kg (289 lb 3.2 oz)         Exam:  NAD  Chest:  HR regular, no murmurs  Lungs clear with no crackles at bases  Abd soft with NABS  No pedal edema        Assessment:   --Well 26 y.o.year old female  --Diagnoses and orders:      1. Physical exam        2. Acne vulgaris        3. Prediabetes        4. BMI 50.0-59.9, adult             Plan:  Repeat exam in 1 year(s)  Acne-start adapalene, BPO if breakouts.  Website recommended for skin care science.  Contemplating bariatric surgery  Covid booster once 2-8mo post covid disease    Immunizations:   Flu : not indicated  Tdap : not indicated 2020-  HPV series: not indicated  Pneumovax : not indicated  HepB series (DM) : not indicated    Zostavax >60 : not  indicated    USPSTF Recommendations:    All women:  Alcohol abuse screening :  reports no history of alcohol use.; negative screen  High blood pressure: BP: 118/70; negative screen  BP >135/80, A1c screen :  standing order  Smoking counseling : no  Depression screening : no  HIV screening declined  Obesity screen : Body mass index is 47.09 kg/m., positive    Age specific:  <25, gonorrhea/chlamydia/RPR screening : declined  >21 Pap smear, stop age 59 : not indicated    >45 lipids :  standing order  <50, folic acid supplementation :  not active.    >50, mammogram q56yrs : not indicated  50-75 colonscopy : not indicated  >74yrs, ASA prevention : not indicated  >65 DEXA scan, or age 35 with risk factors : not indicated    Other:  Health care proxy: PGM Claris Che  Ignacia Palma [phone]    Jeanmarie Plant, MD 11:07 AM  Family Medicine

## 2022-06-07 NOTE — Patient Instructions (Signed)
[email or web address]    Adapalene  BPO

## 2022-06-11 LAB — GYN CYTOLOGY

## 2022-08-24 ENCOUNTER — Ambulatory Visit
Payer: No Typology Code available for payment source | Admitting: Student in an Organized Health Care Education/Training Program

## 2022-08-25 ENCOUNTER — Ambulatory Visit
Payer: No Typology Code available for payment source | Admitting: Student in an Organized Health Care Education/Training Program

## 2022-08-25 ENCOUNTER — Other Ambulatory Visit: Payer: Self-pay

## 2022-09-08 ENCOUNTER — Ambulatory Visit
Payer: No Typology Code available for payment source | Admitting: Student in an Organized Health Care Education/Training Program

## 2022-09-15 ENCOUNTER — Other Ambulatory Visit: Payer: Self-pay

## 2022-09-15 ENCOUNTER — Ambulatory Visit
Payer: No Typology Code available for payment source | Admitting: Student in an Organized Health Care Education/Training Program

## 2022-10-06 ENCOUNTER — Ambulatory Visit
Payer: No Typology Code available for payment source | Admitting: Student in an Organized Health Care Education/Training Program

## 2022-10-11 ENCOUNTER — Ambulatory Visit
Payer: No Typology Code available for payment source | Admitting: Student in an Organized Health Care Education/Training Program

## 2022-10-25 ENCOUNTER — Ambulatory Visit
Payer: No Typology Code available for payment source | Admitting: Student in an Organized Health Care Education/Training Program

## 2022-12-07 ENCOUNTER — Encounter: Payer: Self-pay | Admitting: Primary Care

## 2023-01-30 ENCOUNTER — Other Ambulatory Visit: Payer: Self-pay

## 2023-01-30 ENCOUNTER — Ambulatory Visit: Payer: No Typology Code available for payment source | Attending: Optometry | Admitting: Optometry

## 2023-01-30 DIAGNOSIS — H5213 Myopia, bilateral: Secondary | ICD-10-CM | POA: Insufficient documentation

## 2023-01-30 NOTE — Progress Notes (Signed)
Outpatient Visit      Patient name: Heather Callahan  DOB: Oct 17, 1995       Age: 27 y.o.  MR#: [id number]    Encounter Date: 01/30/2023    Subjective:      Chief Complaint   Patient presents with    Annual Exam     HPI    Heather Callahan is a 27 y.o. female presenting for their routine eye   exam. LEE was 4-71yrs ago.     Pt reports she has been squinting more frequent and is looking to receive   an updated spec rx today. Came in wearing glasses, states she may be   interested in trying Cls. No flashes, floaters or pain. no gtts.     Ocular meds:   None        Last edited by Sheppard Plumber, OD on 01/30/2023  3:01 PM.        has a current medication list which includes the following prescription(s): ibuprofen and loryna.     has No Known Allergies (drug, envir, food or latex).      Past Medical History:   Diagnosis Date    Functional constipation     GERD (gastroesophageal reflux disease)     IBS (irritable bowel syndrome)     Low TSH level     Maternal hypothyroidism 12/28/2014      Past Surgical History:   Procedure Laterality Date    TONSILLECTOMY      TONSILLECTOMY          Specialty Problems    None       ROS    Positive for: Eyes  Negative for: Constitutional, Gastrointestinal, Neurological, Skin,   Genitourinary, Musculoskeletal, HENT, Endocrine, Cardiovascular,   Respiratory, Psychiatric, Allergic/Imm, Heme/Lymph  Last edited by Percell Boston, COA on 01/30/2023  2:01 PM.         Objective:     Base Eye Exam       Visual Acuity (Snellen - Linear)         Right Left    Dist cc 20/25 20/50    Dist ph cc  20/25 -1      Correction: Glasses              Tonometry (iCare tonometry (ICT), 2:10 PM)         Right Left    Pressure 14 16              Pupils         Pupils APD    Right PERRLA None    Left PERRLA None              Visual Fields (Counting fingers)         Left Right     Full Full              Extraocular Movement         Right Left     Full Full              Neuro/Psych       Oriented x3: Yes     Mood/Affect: Normal              Dilation       Both eyes: 1.0% Tropicamide @ 2:10 PM                  Slit Lamp and Fundus Exam       External Exam  Right Left    External Normal ocular adnexae, lacrimal gland & drainage, orbits Normal ocular adnexae, lacrimal gland & drainage, orbits              Slit Lamp Exam         Right Left    Lids/Lashes Normal structure & position Normal structure & position    Conjunctiva/Sclera Normal bulbar/palpebral, conjunctiva, sclera Normal bulbar/palpebral, conjunctiva, sclera    Cornea Normal epithelium, stroma, endothelium, tear film Normal epithelium, stroma, endothelium, tear film    Anterior Chamber Clear & deep Clear & deep    Iris Normal shape, size, morphology Normal shape, size, morphology    Lens Normal cortex, nucleus, anterior/posterior capsule, clarity Normal cortex, nucleus, anterior/posterior capsule, clarity    Vitreous Clear Clear              Fundus Exam         Right Left    Disc large nerve large nerve    C/D Ratio 0.70 0.70    Macula Normal Normal    Vessels Normal Normal    Periphery Normal Normal                  Refraction       Wearing Rx         Sphere Cylinder Axis    Right -2.25 -0.50 163    Left -2.00 -0.25 164      Type: SVL              Manifest Refraction         Sphere Cylinder Axis Dist VA    Right -2.50 -0.50 165 20/20    Left -2.75 Sphere  20/20              Final Rx         Sphere Cylinder Axis Dist VA    Right -2.50 -0.50 165 20/20    Left -2.75 Sphere  20/20      Type: SVL    Expiration Date: 01/29/2025                  Final Rx         Sphere Cylinder Axis Dist VA    Right -2.50 -0.50 165 20/20    Left -2.75 Sphere  20/20      Type: SVL    Expiration Date: 01/29/2025                  No annotated images are attached to the encounter.      Assessment/Plan:      1. Myopia, bilateral             PLAN:    Pt given spec Rx. Pt Rx available through MyChart. Monitor x 1-2 yrs with refraction.    Dilated fundus exam unremarkable OU. Monitor x  1-2 yrs

## 2023-03-27 ENCOUNTER — Other Ambulatory Visit: Payer: Self-pay

## 2023-03-27 ENCOUNTER — Other Ambulatory Visit
Admission: RE | Admit: 2023-03-27 | Discharge: 2023-03-27 | Disposition: A | Discharge status: Home / Self Care | Payer: No Typology Code available for payment source | Source: Ambulatory Visit | Attending: Internal Medicine | Admitting: Internal Medicine

## 2023-03-27 ENCOUNTER — Telehealth: Payer: Self-pay | Admitting: Pharmacist

## 2023-03-27 ENCOUNTER — Encounter: Payer: Self-pay | Admitting: Internal Medicine

## 2023-03-27 ENCOUNTER — Ambulatory Visit: Payer: No Typology Code available for payment source | Admitting: Internal Medicine

## 2023-03-27 VITALS — BP 128/74 | HR 84 | Temp 97.0°F | Ht 68.0 in | Wt 306.5 lb

## 2023-03-27 DIAGNOSIS — R7303 Prediabetes: Secondary | ICD-10-CM

## 2023-03-27 LAB — LIPID PANEL
Chol/HDL Ratio: 6.2
Cholesterol: 231 mg/dL — AB
HDL: 37 mg/dL — ABNORMAL LOW (ref 40–60)
LDL Calculated: 168 mg/dL — AB
Non HDL Cholesterol: 194 mg/dL
Triglycerides: 129 mg/dL

## 2023-03-27 LAB — COMPREHENSIVE METABOLIC PANEL
ALT: 35 U/L (ref 0–35)
AST: 39 U/L — ABNORMAL HIGH (ref 0–35)
Albumin: 4.6 g/dL (ref 3.5–5.2)
Alk Phos: 100 U/L (ref 35–105)
Anion Gap: 8 (ref 7–16)
Bilirubin,Total: 0.6 mg/dL (ref 0.0–1.2)
CO2: 28 mmol/L (ref 20–28)
Calcium: 9.9 mg/dL (ref 8.8–10.2)
Chloride: 103 mmol/L (ref 96–108)
Creatinine: 0.8 mg/dL (ref 0.51–0.95)
Glucose: 92 mg/dL (ref 60–99)
Lab: 9 mg/dL (ref 6–20)
Potassium: 4.6 mmol/L (ref 3.3–5.1)
Sodium: 139 mmol/L (ref 133–145)
Total Protein: 7.8 g/dL — ABNORMAL HIGH (ref 6.3–7.7)
eGFR BY CREAT: 103 *

## 2023-03-27 LAB — CBC AND DIFFERENTIAL
Baso # K/uL: 0 10*3/uL (ref 0.0–0.2)
Eos # K/uL: 0.2 10*3/uL (ref 0.0–0.5)
Hematocrit: 37 % (ref 34–49)
Hemoglobin: 10.9 g/dL — ABNORMAL LOW (ref 11.2–16.0)
IMM Granulocytes #: 0 10*3/uL (ref 0.0–0.0)
IMM Granulocytes: 0.4 %
Lymph # K/uL: 2.7 10*3/uL (ref 1.0–5.0)
MCV: 73 fL — ABNORMAL LOW (ref 75–100)
Mono # K/uL: 0.9 10*3/uL (ref 0.1–1.0)
Neut # K/uL: 3.3 10*3/uL (ref 1.5–6.5)
Nucl RBC # K/uL: 0 10*3/uL (ref 0.0–0.0)
Nucl RBC %: 0 /100 WBC (ref 0.0–0.2)
Platelets: 266 10*3/uL (ref 150–450)
RBC: 5 MIL/uL (ref 4.0–5.5)
RDW: 15.7 % — ABNORMAL HIGH (ref 0.0–15.0)
Seg Neut %: 46.4 %
WBC: 7.2 10*3/uL (ref 3.5–11.0)

## 2023-03-27 MED ORDER — TIRZEPATIDE-WEIGHT MANAGEMENT 2.5 MG/0.5ML SC SOAJ *A*
2.5000 mg | SUBCUTANEOUS | 0 refills | Status: DC
Start: 2023-03-27 — End: 2023-03-27
  Filled 2023-03-27: qty 2, 28d supply, fill #0

## 2023-03-27 MED ORDER — TIRZEPATIDE-WEIGHT MANAGEMENT 2.5 MG/0.5ML SC SOAJ *A*
2.5000 mg | SUBCUTANEOUS | 0 refills | Status: DC
Start: 2023-03-27 — End: 2023-06-11

## 2023-03-27 NOTE — Telephone Encounter (Signed)
Please submit a prior authorization for the following medication:  Zepbound 2.5 mg/0.5 mL    Insurance Plan: [employer]   ID: [id number]  BIN: [id number]  PCN: [id number]  GROUP: [id number]      Thank you  Jacqulyn Ducking, PharmD  [facility]

## 2023-03-27 NOTE — Progress Notes (Signed)
[facility]     Subjective     Heather Callahan is a 27 y.o. female who presents for Medication Problem/Question and Follow-up (COVID vaccine )  History of Present Illness  The patient presents for evaluation of weight loss.    She is considering the use of Mounjaro to aid in her weight loss journey. Her diet is not specific to any particular regimen. She has attempted various weight loss programs in the past but currently practices intermittent fasting and monitors her caloric intake, which is typically under 2000 calories per day. She maintains an active lifestyle, walking a mile daily and aiming for 10,000 steps.    She has been diagnosed with Polycystic Ovary Syndrome (PCOS).    She reports no symptoms of diabetes such as frequent urination, headaches, or excessive thirst.    She has expressed interest in receiving the Pfizer COVID-19 vaccine.    She is aware that nausea can be a side effect of Zepbound. She is capable of self-administering injections but prefers not to.    She continues to take birth control pills and does not use ibuprofen.    IMMUNIZATIONS  She got influenza vaccine at work.       Objective   Blood pressure 128/74, pulse 84, temperature 36.1 C (97 F), temperature source Temporal, height 1.727 m (5\' 8" ), weight (!) 139 kg (306 lb 8 oz), SpO2 98%.  Physical Exam    Physical Exam  Constitutional:       Appearance: Normal appearance.   Neurological:      Mental Status: She is alert.         Results  Laboratory Studies  A1c was 5.8.          Assessment & Plan  1. Weight management, prediabetes  Her A1c was previously in the prediabetes range at 5.8, and she has gained approximately 10 pounds since then. She is currently practicing intermittent fasting and tracking her calorie intake, aiming for under 2000 calories per day. She also walks a mile daily, achieving about 3000 steps. The potential side effects of Zepbound, including nausea, bloating, diarrhea, and GI distress, were  discussed. These side effects typically subside within 3 to 4 days after the injection and may decrease in duration with subsequent doses as her body adjusts. The importance of starting at the lowest dose and gradually increasing it was emphasized. The process of prior authorization was also explained. Labs will be repeated to assess her current health status. A prescription for Zepbound will be sent to the pharmacy, starting at a dose of 2.5 mg for 4 weeks, with a plan to increase to 5 mg thereafter. A kidney function test will be ordered one month after the initiation of this medication. A pharmacy visit will be scheduled in approximately 10 days for further instruction and monitoring.    2. COVID-19 vaccination.  She expressed interest in receiving the Pfizer COVID-19 vaccine, not currently available - recommend pharmacy    3. Medication management.  She continues to take her birth control pills. She does not regularly take ibuprofen but does not find it bothersome to have it listed.                 Author: Eugenia Pancoast, MD  Note signed: 03/27/2023

## 2023-03-27 NOTE — Telephone Encounter (Signed)
Requested Prescriptions     Pending Prescriptions Disp Refills    tirzepatide for weight management (ZEPBOUND) 2.5 MG/0.5ML pen 2 mL 0     Sig: Inject 0.5 mLs (2.5 mg total) into the skin once a week.       Last office visit:   03/27/2023  Last telehome visit:   Visit date not found  Patients upcoming appointments:  Future Appointments   Date Time Provider Department Center   04/09/2023  4:20 PM Amering, Shawn Route, PharmD [facility] None   06/08/2023 10:40 AM Pruett, Kelby Aline, MD [facility] None   01/02/2024  2:30 PM Dellanno, Donella Stade, OD OCT None     BP Readings from Last 3 Encounters:   03/27/23 128/74   06/07/22 118/70   04/11/22 119/81      Recent Lab results:  GENERAL CHEMISTRY   Recent Labs     04/03/22  0925   NA 139   K 4.8   CL 105   CO2 22   GAP 12   UN 7   CREAT 0.82   GLU 104*   CA 9.5      LIPID PROFILE   Recent Labs     04/03/22  0925   CHOL 222*   TRIG 149   HDL 61*   LDLC 131*      LIVER PROFILE   Recent Labs     04/03/22  0925   ALT 14   AST 22   ALK 93   TB 0.4      DIABETES THYROID   Recent Labs     04/03/22  0925   HA1C 5.8*    Recent Labs     04/03/22  0925   TSH 0.61         Pending/Orders Labs:  Lab Frequency Next Occurrence   Hemoglobin A1c Once 03/27/2023   CBC and differential Once 03/27/2023   Lipid Panel (Reflex to Direct  LDL if Triglycerides more than 400) Once 03/27/2023   Comprehensive metabolic panel Once 03/27/2023   Basic metabolic panel Once 04/26/2023

## 2023-03-28 ENCOUNTER — Other Ambulatory Visit: Payer: Self-pay | Admitting: Internal Medicine

## 2023-03-28 DIAGNOSIS — D509 Iron deficiency anemia, unspecified: Secondary | ICD-10-CM

## 2023-03-28 LAB — TRANSFERRIN: Transferrin: 287 mg/dL (ref 200–360)

## 2023-03-28 LAB — TIBC
Iron: 42 ug/dL (ref 34–165)
TIBC: 387 ug/dL (ref 250–450)
Transferrin Saturation: 11 % — ABNORMAL LOW (ref 15–50)

## 2023-03-28 LAB — FERRITIN: Ferritin: 28 ng/mL (ref 10–120)

## 2023-03-28 LAB — HEMOGLOBIN A1C: Hemoglobin A1C: 6.3 % — ABNORMAL HIGH

## 2023-03-30 ENCOUNTER — Other Ambulatory Visit: Payer: Self-pay

## 2023-04-05 ENCOUNTER — Other Ambulatory Visit: Payer: Self-pay

## 2023-04-06 ENCOUNTER — Other Ambulatory Visit: Payer: Self-pay

## 2023-04-09 ENCOUNTER — Other Ambulatory Visit: Payer: Self-pay

## 2023-04-09 ENCOUNTER — Ambulatory Visit
Payer: No Typology Code available for payment source | Attending: Internal Medicine | Admitting: Pharmacist Clinician (PhC)/ Clinical Pharmacy Specialist

## 2023-04-09 ENCOUNTER — Encounter: Payer: Self-pay | Admitting: Pharmacist Clinician (PhC)/ Clinical Pharmacy Specialist

## 2023-04-09 VITALS — BP 131/78 | HR 90 | Temp 97.5°F | Ht 65.0 in | Wt 308.0 lb

## 2023-04-09 DIAGNOSIS — Z6841 Body Mass Index (BMI) 40.0 and over, adult: Secondary | ICD-10-CM

## 2023-04-09 DIAGNOSIS — Z79899 Other long term (current) drug therapy: Secondary | ICD-10-CM

## 2023-04-09 NOTE — Patient Instructions (Signed)
We do recommend trying some lifestyle changes if the nausea/vomiting/constipation are bothersome:  Eat small meals 1/4 to 1/3 of normal portion  Eat slowly  Eat light foods and avoid heavy/fatty foods and spicy or overly sweet foods  Avoid laying down after eating/drinking   Avoid drinking large amounts of liquid (no more than 4 to 6 ounces at a time)  Avoid drinking with a straw  Keep a food journal to keep track of potential triggers and avoid those foods for a while  If feeling nauseous, try getting fresh air or taking a walk   Ginger tea or chews can be helpful  If you have questions about injection technique let me know  Let me know if you get the medication and I can set a reminder to help increase the dose every 4 weeks.   Things to consider:  These medications work best when combined with lifestyle changes  Exercise is important for maintaining weight and decreasing muscle loss  Changes in nutrition can help with weight loss and metabolic health  We are finding that many patients with obesity who start medication need them for the rest of their lives  85% of patients who stop their medication regain the weight

## 2023-04-09 NOTE — Progress Notes (Signed)
Comprehensive Medication Management Note    Heather Callahan is a 27 y.o. female who presents for initial visit with the clinical pharmacist. Method of visit: In Person. Patient was referred by PCP for comprehensive medication management in the setting of uncontrolled obesity and medication teaching .  Patient's medications, preferred pharmacy and allergies were updated and marked as reviewed, as appropriate.        At last visit with Dr. Eustaquio Callahan pt agreed to start tirzepatide for weight loss.     Today  Obesity  Current weight 139.7 kg  Medication  Tirzepatide for weight loss  Pt does not have med yet because still waiting on prior authorization            Vitals:    04/09/23 1625   BP: 131/78   Pulse: 90   Temp: 36.4 C (97.5 F)   Weight: (!) 139.7 kg (308 lb)   Height: 1.651 m (5\' 5" )        Additional labs/vitals as documented in chart - reviewed all relevant objective info at time of visit.    Current Outpatient Medications   Medication Sig Dispense Refill    tirzepatide for weight management (ZEPBOUND) 2.5 MG/0.5ML pen Inject 0.5 mLs (2.5 mg total) into the skin once a week. 2 mL 0    ibuprofen (ADVIL,MOTRIN) 600 MG tablet Take 1 tablet (600 mg total) by mouth 3 times daily as needed 30 tablet 0    LORYNA 3-0.02 MG per tablet 1 BY MOUTH EVERY DAY  2     No current facility-administered medications for this visit.         Assessment and Plan:  With regards to medication management for this patient, the following aspects were reviewed: Indication, effectiveness, safety and convenience of each medication. The patient's medical conditions and medications were assessed, evaluated, and deemed meeting goals of drug therapy except as listed below:     1. Obesity  Discussed how to administer medication, mechanism of action, expected clinical and adverse effects, and how to store and dispose of medication.   Used demonstration pen to counsel pt  Pt was able to repeat instructions back regarding medication   Advised  pt I would follow up on prior auth and once she starts med, I will help titrate dose.         4. Other Medication-Related Interventions: In addition to the medication changes noted above, these medication-related interventions were completed by the Pharmacist during today's visit:  general medication related education + device teaching + disease state education    This report sent to Dr. Eustaquio Callahan for review and final approval.     Patient was able to verbalize understanding and repeat back key educational points.     Follow-up: Pharmacist will follow-up with patient via mychart in 1 week to follow up on prior auth and tirzepatide start.     Patient Status: Open to [facility]     Duration of Visit: 16-30 min    Thank you for allowing me to participate in the care of this patient. Please contact me with any questions.     Pearlie Oyster, PharmD, [facility]   [address] [place], [place] [address]  [phone]

## 2023-04-10 NOTE — Telephone Encounter (Signed)
Zepbound 2.5 mg/0.5 mL was approved through 5.6.25

## 2023-04-11 ENCOUNTER — Other Ambulatory Visit: Payer: Self-pay

## 2023-04-17 DIAGNOSIS — H52221 Regular astigmatism, right eye: Secondary | ICD-10-CM

## 2023-04-17 DIAGNOSIS — H5213 Myopia, bilateral: Secondary | ICD-10-CM

## 2023-04-19 ENCOUNTER — Telehealth
Payer: No Typology Code available for payment source | Admitting: Pharmacist Clinician (PhC)/ Clinical Pharmacy Specialist

## 2023-05-05 ENCOUNTER — Encounter: Payer: Self-pay | Admitting: Pharmacist Clinician (PhC)/ Clinical Pharmacy Specialist

## 2023-05-05 DIAGNOSIS — Z6841 Body Mass Index (BMI) 40.0 and over, adult: Secondary | ICD-10-CM

## 2023-05-07 MED ORDER — TIRZEPATIDE-WEIGHT MANAGEMENT 5 MG/0.5ML SC SOAJ *I*
5.0000 mg | SUBCUTANEOUS | 3 refills | Status: DC
Start: 2023-05-07 — End: 2023-06-11

## 2023-06-08 ENCOUNTER — Encounter: Payer: No Typology Code available for payment source | Admitting: Internal Medicine

## 2023-06-10 ENCOUNTER — Encounter: Payer: Self-pay | Admitting: Internal Medicine

## 2023-06-10 DIAGNOSIS — Z6841 Body Mass Index (BMI) 40.0 and over, adult: Secondary | ICD-10-CM

## 2023-06-11 ENCOUNTER — Other Ambulatory Visit: Payer: Self-pay | Admitting: Internal Medicine

## 2023-06-11 DIAGNOSIS — Z6841 Body Mass Index (BMI) 40.0 and over, adult: Secondary | ICD-10-CM

## 2023-06-11 MED ORDER — TIRZEPATIDE 7.5 MG/0.5ML SC SOAJ *I*
7.5000 mg | SUBCUTANEOUS | 5 refills | Status: DC
Start: 2023-06-11 — End: 2023-06-12

## 2023-06-12 NOTE — Telephone Encounter (Signed)
Last office visit:   03/27/2023  Last telehome visit:   Visit date not found  Patients upcoming appointments:  Future Appointments   Date Time Provider Department Center   08/29/2023  3:20 PM Pruett, Kelby Aline, MD [facility] None   01/02/2024  2:30 PM Dellanno, Donella Stade, OD OCT None     BP Readings from Last 3 Encounters:   04/09/23 131/78   03/27/23 128/74   06/07/22 118/70      Recent Lab results:  GENERAL CHEMISTRY   Recent Labs     03/27/23  1621   NA 139   K 4.6   CL 103   CO2 28   GAP 8   UN 9   CREAT 0.80   GLU 92   CA 9.9      LIPID PROFILE   Recent Labs     03/27/23  1621   CHOL 231*   TRIG 129   HDL 37*   LDLC 168*      LIVER PROFILE   Recent Labs     03/27/23  1621   ALT 35   AST 39*   ALK 100   TB 0.6      DIABETES THYROID   Recent Labs     03/27/23  1621   HA1C 6.3*    No value within the past 365 days      Pending/Orders Labs:  Lab Frequency Next Occurrence   Iron Once 03/28/2023   TIBC Once 03/28/2023   Ferritin Once 03/28/2023   Transferrin Once 03/28/2023

## 2023-07-05 DIAGNOSIS — H5213 Myopia, bilateral: Secondary | ICD-10-CM

## 2023-07-05 DIAGNOSIS — H52221 Regular astigmatism, right eye: Secondary | ICD-10-CM

## 2023-07-09 ENCOUNTER — Other Ambulatory Visit: Payer: Self-pay | Admitting: Internal Medicine

## 2023-07-09 DIAGNOSIS — Z6841 Body Mass Index (BMI) 40.0 and over, adult: Secondary | ICD-10-CM

## 2023-07-09 MED ORDER — TIRZEPATIDE-WEIGHT MANAGEMENT 7.5 MG/0.5ML SC SOAJ *I*
7.5000 mg | SUBCUTANEOUS | 1 refills | Status: DC
Start: 2023-07-09 — End: 2023-08-29

## 2023-07-09 NOTE — Telephone Encounter (Signed)
 Last office visit:   03/27/2023  Last telehome visit:   Visit date not found  Patients upcoming appointments:  Future Appointments   Date Time Provider Department Center   08/29/2023  3:20 PM Pruett, Kelby Aline, MD [facility] None   01/02/2024  2:30 PM Dellanno, Donella Stade, OD OCT None     BP Readings from Last 3 Encounters:   04/09/23 131/78   03/27/23 128/74   06/07/22 118/70      Recent Lab results:  GENERAL CHEMISTRY   Recent Labs     03/27/23  1621   NA 139   K 4.6   CL 103   CO2 28   GAP 8   UN 9   CREAT 0.80   GLU 92   CA 9.9      LIPID PROFILE   Recent Labs     03/27/23  1621   CHOL 231*   TRIG 129   HDL 37*   LDLC 168*      LIVER PROFILE   Recent Labs     03/27/23  1621   ALT 35   AST 39*   ALK 100   TB 0.6      DIABETES THYROID   Recent Labs     03/27/23  1621   HA1C 6.3*    No value within the past 365 days      Pending/Orders Labs:  Lab Frequency Next Occurrence   Iron Once 03/28/2023   TIBC Once 03/28/2023   Ferritin Once 03/28/2023   Transferrin Once 03/28/2023

## 2023-07-27 ENCOUNTER — Ambulatory Visit: Payer: No Typology Code available for payment source | Admitting: Internal Medicine

## 2023-08-03 ENCOUNTER — Encounter: Payer: Self-pay | Admitting: Internal Medicine

## 2023-08-03 DIAGNOSIS — Z6841 Body Mass Index (BMI) 40.0 and over, adult: Secondary | ICD-10-CM

## 2023-08-06 MED ORDER — TIRZEPATIDE-WEIGHT MANAGEMENT 10 MG/0.5ML SC SOAJ *I*
10.0000 mg | SUBCUTANEOUS | 5 refills | Status: DC
Start: 2023-08-06 — End: 2023-11-20

## 2023-08-29 ENCOUNTER — Other Ambulatory Visit: Payer: Self-pay

## 2023-08-29 ENCOUNTER — Ambulatory Visit: Payer: No Typology Code available for payment source | Admitting: Internal Medicine

## 2023-08-29 ENCOUNTER — Encounter: Payer: Self-pay | Admitting: Internal Medicine

## 2023-08-29 VITALS — BP 110/68 | HR 99 | Temp 96.5°F | Ht 65.0 in | Wt 281.9 lb

## 2023-08-29 DIAGNOSIS — Z0000 Encounter for general adult medical examination without abnormal findings: Secondary | ICD-10-CM

## 2023-08-29 DIAGNOSIS — Z6841 Body Mass Index (BMI) 40.0 and over, adult: Secondary | ICD-10-CM

## 2023-08-29 DIAGNOSIS — R7303 Prediabetes: Secondary | ICD-10-CM

## 2023-08-29 DIAGNOSIS — Z862 Personal history of diseases of the blood and blood-forming organs and certain disorders involving the immune mechanism: Secondary | ICD-10-CM

## 2023-08-29 NOTE — Progress Notes (Signed)
 Medical Student Note, Family Medicine Clinic Visit    SUBJECTIVE    Chief Complaint   Patient presents with    Annual Exam   Conception is a 28 y/o F w/ PMH significant for pre-diabetes who presents today for AWV.    History of present illness:    Ear impaction  Lots of debris in ear when wearing earbuds. No hearing loss, ear discharge, ear pain.  Otherwise feels well  No side effects from recently increased dose of tirzepatide (7.5 -> 10 mg)  Happy about recent weight loss from tirzepatide      Relevant past medical, surgical, family, and social history:  No concerns w/ menses.  Continuing to work on diet and exercise. Hopeful that exercise will improve with better weather, likes to walk outside.  Lives w/ grandmother, both independent, no concerns for safety  No concerns w/ social determinants of health      OBJECTIVE    BP 110/68 (BP Location: Left arm, Patient Position: Sitting)   Pulse 99   Temp 35.8 C (96.5 F) (Temporal)   Ht 1.651 m (5\' 5" )   Wt 127.9 kg (281 lb 14.4 oz)   LMP 08/26/2023   SpO2 98%   BMI 46.91 kg/m   General: VSS  Physical Exam  Constitutional:       Appearance: Normal appearance.   HENT:      Right Ear: Tympanic membrane normal. There is no impacted cerumen.      Left Ear: Tympanic membrane normal. There is no impacted cerumen.      Nose: Nose normal.      Mouth/Throat:      Mouth: Mucous membranes are moist.   Cardiovascular:      Rate and Rhythm: Normal rate and regular rhythm.      Heart sounds: Normal heart sounds.   Pulmonary:      Effort: No respiratory distress.      Breath sounds: Normal breath sounds. No wheezing or rales.   Abdominal:      General: Abdomen is flat. Bowel sounds are normal.      Palpations: Abdomen is soft.      Tenderness: There is no abdominal tenderness. There is no guarding.   Musculoskeletal:      Right lower leg: No edema.      Left lower leg: No edema.   Skin:     General: Skin is warm and dry.      Capillary Refill: Capillary refill takes less  than 2 seconds.   Neurological:      General: No focal deficit present.      Mental Status: She is alert.   Psychiatric:         Mood and Affect: Mood normal.           Relevant labs and test results, external notes reviewed:  Reviewed previous CMP, hemoglobin A1C    ASSESSMENT/PLAN  Lucielle is a 28 y/o F who presents for AWV:    1. Annual physical exam (Primary)  Overall Zahriah is doing quite well. Discussed general preventative healthcare, working on diet, exercise, questions about tirzepatide. No need for more than annual visits at this time.   - overall bilateral ear canals not significantly impacted by cerumen    2. BMI 45.0-49.9, adult   - continue tirzepatide 10 mg    3. Prediabetes - will order labs to check diabetes levels   - continue tirzepatide 10 mg  - Hemoglobin A1c; Standing  - CBC and  differential; Standing    4. History of anemia   - iron panel ordered      Followup in 1 year or as needed.      Alberteen Sam  Medical Student  [facility]  08/29/2023 3:30 PM    The student was personally supervised by me during the patient examination. I personally saw and evaluated the patient, provided the medical decision-making, and reviewed and verified the key elements of the student documentation. I have edited the student's note and confirm the findings and plan of care as documented.     Esther Broyles M. Eustaquio Maize, MD  Family Medicine Attending Physician

## 2023-08-29 NOTE — Progress Notes (Deleted)
 {  TIP - You do not need to delete this information; it will disappear when you sign the note!    Medical/APP students may use this note type as determined by your supervisor.  All other students should complete their documentation using the Student Note (note type).    If appropriately edited/addended by your supervisor using the SmartPhrase .STUDENTADDEND, this note type may be used for clinical communication, and to satisfy regulatory and/or billing requirements.  Once addended (or co-signed), this note type will be immediately shared electronically with the patient and their proxies through MyChart.    :[id number]}

## 2023-09-03 ENCOUNTER — Other Ambulatory Visit
Admission: RE | Admit: 2023-09-03 | Discharge: 2023-09-03 | Disposition: A | Discharge status: Home / Self Care | Source: Ambulatory Visit | Attending: Internal Medicine | Admitting: Internal Medicine

## 2023-09-03 ENCOUNTER — Other Ambulatory Visit: Payer: Self-pay | Admitting: Internal Medicine

## 2023-09-03 ENCOUNTER — Encounter: Payer: Self-pay | Admitting: Internal Medicine

## 2023-09-03 DIAGNOSIS — D649 Anemia, unspecified: Secondary | ICD-10-CM

## 2023-09-03 DIAGNOSIS — R7303 Prediabetes: Secondary | ICD-10-CM | POA: Insufficient documentation

## 2023-09-03 LAB — CBC AND DIFFERENTIAL
Baso # K/uL: 0 10*3/uL (ref 0.0–0.2)
Eos # K/uL: 0.2 10*3/uL (ref 0.0–0.5)
Hematocrit: 37 % (ref 34–49)
Hemoglobin: 10.9 g/dL — ABNORMAL LOW (ref 11.2–16.0)
IMM Granulocytes #: 0 10*3/uL (ref 0.0–0.0)
IMM Granulocytes: 0.3 %
Lymph # K/uL: 2.3 10*3/uL (ref 1.0–5.0)
MCV: 74 fL — ABNORMAL LOW (ref 75–100)
Mono # K/uL: 0.6 10*3/uL (ref 0.1–1.0)
Neut # K/uL: 4 10*3/uL (ref 1.5–6.5)
Nucl RBC # K/uL: 0 10*3/uL (ref 0.0–0.0)
Nucl RBC %: 0 /100{WBCs} (ref 0.0–0.2)
Platelets: 304 10*3/uL (ref 150–450)
RBC: 4.9 MIL/uL (ref 4.0–5.5)
RDW: 14.6 % (ref 0.0–15.0)
Seg Neut %: 55.6 %
WBC: 7.1 10*3/uL (ref 3.5–11.0)

## 2023-09-03 LAB — HEMOGLOBIN A1C: Hemoglobin A1C: 5.4 %

## 2023-09-03 LAB — UNABLE TO PERFORM ADD-ON TESTING 1

## 2023-09-04 MED ORDER — FERROUS GLUCONATE 324 (37.5 FE) MG PO TABS *WRAPPED*
324.0000 mg | ORAL_TABLET | ORAL | 0 refills | Status: DC
Start: 2023-09-04 — End: 2023-11-27

## 2023-09-14 ENCOUNTER — Ambulatory Visit: Attending: Primary Care | Admitting: Medical

## 2023-09-14 DIAGNOSIS — J101 Influenza due to other identified influenza virus with other respiratory manifestations: Secondary | ICD-10-CM

## 2023-09-14 MED ORDER — OSELTAMIVIR PHOSPHATE 75 MG PO CAPS *I*
75.0000 mg | ORAL_CAPSULE | Freq: Two times a day (BID) | ORAL | 0 refills | Status: AC
Start: 2023-09-14 — End: 2023-09-19

## 2023-09-14 NOTE — Patient Instructions (Signed)
 If you have any questions or concerns about today's visit please contact the [facility] Primary Care Digital Health Team at [phone]     If symptoms become severe such as shortness of breath, wheezing, chest pain, or any other rapidly worsening symptom patient should seek medical attention immediately.       Upper respiratory infection, sinusitis, bronchitis, viral sore throats symptomatic treatment:     Colds, most bronchitis and most sinus infections are viral and are better treated without antibiotics.  Antibiotics can cause side effects such as diarrhea including C. diff diarrhea which is a severe form of diarrhea triggered by antibiotic use.  Also, antibiotics are not effective for viral infections and overuse of them can lead to resistant bacterial formation so when we need to use them they are not as effective.     Sinus infections or sometimes bronchitis if they persist for over 10-12 days, and start to have increasing thick green discharge nasally may respond to antibiotics.     It is best to treat and upper respiratory infection according to the symptoms.  Rest and fluids allows your own body's immune system to fight the infection which is the best medication.        For sinus congestion: you may use over-the-counter nasal decongestant sprays such as Afrin or Neo-Synephrine or generic versions.  These should only be used for 3 days since they may cause rebound swelling in your nose and cause you to get addicted to them.  Addicted meaning when you stop using it your nose swells shut so you have to keep using it to open it up.     Sinus rinses or Neti Pot are ways to flush your nasal passages and sinuses with relatively large amounts of salt water solution which helps a lot to prevent colds going into sinus infections.     Flonase (fluticasone) or other steroid nasal sprays are also useful to prevent nasal congestion and sinus infections.  They are also good for allergies.  These are available over-the-counter  although some are by prescription.     ** Using a cool mist humidifier during the dry winter months is especially helpful for your sinus congestion**      For coughing:  Unfortunately a recent analysis of studies showed that nothing is that effective for cough due to the common cold.  However, honey and zinc may be the most effective.  You may take guaifenesin over-the-counter.  This is often combined with dextromethorphan.  This would be in Robitussin DM, Delsym, or Mucinex and many other brands.  Some people also responded well to albuterol which is an asthma inhaler or even steroids if they have a history of asthma or COPD (chronic obstructive pulmonary disease).  It is very normal to cough for a month or more after a cold.     For fever and body aches:   - You may take Take Tylenol (acetaminophen) 500 mg 2 tablets 3 times daily as needed.    - If this in not effective you may take NSAIDs/anti-inflammatory agents such as naproxen (Aleve) 220 mg tablets 1-2 tablets twice/day, OR ibuprofen (motrin/advil) 200 mg tablets 2-4 tablets 3-times/day.    - These medications have the risk of causing bleeding ulcers, kidney damage, raising blood pressure, and even increasing her risk of heart attack, stroke, and death.  Aleve (naproxen) is preferable because in studies it did not increase the risk of heart attack.  NSAIDs should not be used if you have chronic  kidney disease or uncontrolled hypertension.  They should typically not be used more than 3 or 4 weeks although in some cases they are used for longer periods of time.     For sore throats: you can take Tylenol or NSAID'S as above and/or throat lozenges or  Chloraseptic or many other brands and generics     I recommended against taking multisymptom cold and flu medications.  These medicines combine multiple types of medication such as pain relievers, antihistamines, decongestants, and cough medicines all into 1 pill or liquid.  The problem is most people are not sure  exactly what they are getting when they take this.  It is better to treat your symptoms individually as above so you know what you are getting.

## 2023-09-14 NOTE — Telephone Encounter (Signed)
 Patient did odvv, see note

## 2023-09-14 NOTE — Progress Notes (Signed)
 TeleHome Video Encounter   Mode of Communication with Patient for This Visit    Mode of Communication with Patient for This Visit: Video  Patient Location: Home  Provider Location: Home       Visit date: 09/14/2023  Consent was obtained from the patient to complete this video visit; including the potential for financial liability.     Subjective   Heather Callahan,[birth year], had concerns including Influenza.  Heather Callahan is a 28 y.o. female, h/o pre-diabetes and BMI of 47, p/w viral symptoms, in setting of Flu A, x 2 days    Reports associated fatigue, malaise, generalized myalgias, subj fever/chills, NC, and coughing    Pt hasn't tested yet, but she has been helping take care of her grandma, who is currently hospitalized with Flu A, in the hospital room daily with her the last few days, w/o wearing a mask    Denies all of the following:  Fever/chills, severe HA, facial swelling, significant sore throat, CP, or SOB.  Denies any chance of pregnancy or breastfeeding        ROS:  A review of systems was preformed and the pertinent positives/negatives were noted above in the HPI.      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 female in NAD and non-toxic appearing, although she does look like she doesn't  feel well  Speaking in full sentences w/o issue, audible wheeze, or signs of SOB      RECENT RESULTS:      GENERAL CHEMISTRY   Recent Labs     03/27/23  1621   NA 139   K 4.6   CL 103   CO2 28   GAP 8   UN 9   CREAT 0.80   GLU 92   CA 9.9      LIVER PROFILE   Recent Labs     03/27/23  1621   ALT 35   AST 39*   ALK 100   TB 0.6      DIABETES THYROID   Recent Labs     09/03/23  1241 03/27/23  1621   HA1C 5.4 6.3*    No value within the past 365 days               Assessment    28 y.o. female, h/o pre-diabetes and BMI of 47, p/w viral symptoms, in setting of Flu A, x 2 days  Very close contact + for Flu A   Mild-moderate symptoms   High Risk to progression of disease  No red flag symptoms  eGFR of 103 in 03/2023    Will send in Rx for Tamiflu    Encouraged rest and hydration, along with OTC motrin/tylenol as needed for pain/fever.    Discussed red flag symptoms with pt and encouraged them to f/u for in-person visit if symptoms aren't resolving in approx 5 days; sooner if worse or if they have any symptoms that are concerning to them.                Follow up: Follow up in about 5 days (around 09/19/2023), or if symptoms worsen or fail to improve, for Yearly Check-up, Reassessment.  Author:   Ivy Lynn, PA    [facility] Team  Note signed: 09/14/2023         [1]   Social History  Tobacco Use    Smoking status: Never    Smokeless tobacco: Never   Vaping Use    Vaping status: Never Used   Substance Use Topics    Alcohol use: No    Drug use: No   [2] No Known Allergies (drug, envir, food or latex)

## 2023-10-02 LAB — CHOLESTEROL, TOTAL
Cholesterol: 221 mg/dL
Cholesterol: 221 mg/dL
Glucose, Bld: 84
Glucose, Bld: 64
HDL Cholesterol: 45
HDL: 45 mg/dL
LDL Calculated: 147 mg/dL
LDL Cholesterol,Direct: 147
Triglycerides: 150 mg/dL
Triglycerides: 150 mg/dL

## 2023-10-12 ENCOUNTER — Encounter: Payer: Self-pay | Admitting: Internal Medicine

## 2023-10-17 ENCOUNTER — Ambulatory Visit: Admitting: Physical Therapy

## 2023-10-31 ENCOUNTER — Ambulatory Visit: Payer: Self-pay | Admitting: Physical Therapy

## 2023-10-31 ENCOUNTER — Other Ambulatory Visit: Payer: Self-pay

## 2023-10-31 ENCOUNTER — Other Ambulatory Visit: Payer: Self-pay | Admitting: Physician Assistant

## 2023-11-20 ENCOUNTER — Other Ambulatory Visit: Payer: Self-pay | Admitting: Internal Medicine

## 2023-11-20 DIAGNOSIS — Z6841 Body Mass Index (BMI) 40.0 and over, adult: Secondary | ICD-10-CM

## 2023-11-20 MED ORDER — TIRZEPATIDE-WEIGHT MANAGEMENT 10 MG/0.5ML SC SOAJ *I*
10.0000 mg | SUBCUTANEOUS | 5 refills | Status: DC
Start: 2023-11-20 — End: 2024-03-14

## 2023-11-20 NOTE — Telephone Encounter (Signed)
 Requested Prescriptions     Pending Prescriptions Disp Refills    tirzepatide  for weight management, 10mg /dose, (ZEPBOUND ) 10 mg/0.22mL pen 2 mL 5     Sig: Inject 10 mg into the skin once a week.    Last Office Visit: 08/29/2023  Last Telemedicine Visit: Visit date not found      Next Appointment: Visit date not found  Recent No Show Appointments       No appointments to display

## 2023-11-20 NOTE — Telephone Encounter (Signed)
 Last office visit:   08/29/2023  Last telehome visit:   Visit date not found  Patients upcoming appointments:  Future Appointments   Date Time Provider Department Center   11/21/2023  5:45 PM Gearld Keep, ATC [facility] None   01/08/2024  2:30 PM Dellanno, Karie Ou, OD [facility] None   08/29/2024  1:00 PM Pruett, Burnell Carry, MD [facility] None     BP Readings from Last 3 Encounters:   08/29/23 110/68   04/09/23 131/78   03/27/23 128/74      Recent Lab results:  GENERAL CHEMISTRY   Recent Labs     03/27/23  1621   NA 139   K 4.6   CL 103   CO2 28   GAP 8   UN 9   CREAT 0.80   GLU 92   CA 9.9      LIPID PROFILE   Recent Labs     10/02/23  0000 03/27/23  1621   CHOL 221  221 231*   TRIG 150  150 129   HDL 45 37*   LDLC 147 168*      LIVER PROFILE   Recent Labs     03/27/23  1621   ALT 35   AST 39*   ALK 100   TB 0.6      DIABETES THYROID    Recent Labs     09/03/23  1241 03/27/23  1621   HA1C 5.4 6.3*    No value within the past 365 days      Pending/Orders Labs:  Lab Frequency Next Occurrence   Iron Once 03/28/2023   TIBC Once 03/28/2023   Ferritin Once 03/28/2023   Transferrin Once 03/28/2023   Ferritin Once 09/03/2023   Iron Once 09/03/2023   Vitamin B12 Once 09/03/2023   Folate Once 09/03/2023   TIBC Once 09/03/2023   Hemoglobin A1c SEE COMMENTS 11/26/2023, 12/03/2023, 12/10/2023, 12/17/2023, 12/24/2023, 12/31/2023, 01/07/2024, 01/14/2024, 01/21/2024, 01/28/2024, 02/04/2024, 02/11/2024   CBC and differential SEE COMMENTS 11/26/2023, 12/03/2023, 12/10/2023, 12/17/2023, 12/24/2023, 12/31/2023, 01/07/2024, 01/14/2024, 01/21/2024, 01/28/2024, 02/04/2024, 02/11/2024

## 2023-11-21 ENCOUNTER — Ambulatory Visit: Payer: Self-pay | Admitting: Physical Therapy

## 2023-11-21 NOTE — Telephone Encounter (Signed)
 Zepbound  10MG /0.5ML pen-injectors were approved through 5.21.26

## 2023-11-26 ENCOUNTER — Other Ambulatory Visit: Payer: Self-pay | Admitting: Internal Medicine

## 2023-11-27 ENCOUNTER — Encounter: Payer: Self-pay | Admitting: Internal Medicine

## 2023-11-27 DIAGNOSIS — D509 Iron deficiency anemia, unspecified: Secondary | ICD-10-CM

## 2023-11-27 NOTE — Telephone Encounter (Signed)
 Last office visit:   08/29/2023  Last telehome visit:   Visit date not found  Patients upcoming appointments:  Future Appointments   Date Time Provider Department Center   01/08/2024  2:30 PM Glendora Landsman, OD [facility] None   08/29/2024  1:00 PM Pruett, Burnell Carry, MD [facility] None     BP Readings from Last 3 Encounters:   08/29/23 110/68   04/09/23 131/78   03/27/23 128/74      Recent Lab results:  GENERAL CHEMISTRY   Recent Labs     03/27/23  1621   NA 139   K 4.6   CL 103   CO2 28   GAP 8   UN 9   CREAT 0.80   GLU 92   CA 9.9      LIPID PROFILE   Recent Labs     10/02/23  0000 03/27/23  1621   CHOL 221  221 231*   TRIG 150  150 129   HDL 45 37*   LDLC 147 168*      LIVER PROFILE   Recent Labs     03/27/23  1621   ALT 35   AST 39*   ALK 100   TB 0.6      DIABETES THYROID    Recent Labs     09/03/23  1241 03/27/23  1621   HA1C 5.4 6.3*    No value within the past 365 days      Pending/Orders Labs:  Lab Frequency Next Occurrence   Iron Once 03/28/2023   TIBC Once 03/28/2023   Ferritin Once 03/28/2023   Transferrin Once 03/28/2023   Ferritin Once 09/03/2023   Iron Once 09/03/2023   Vitamin B12 Once 09/03/2023   Folate Once 09/03/2023   TIBC Once 09/03/2023   Hemoglobin A1c SEE COMMENTS 12/03/2023, 12/10/2023, 12/17/2023, 12/24/2023, 12/31/2023, 01/07/2024, 01/14/2024, 01/21/2024, 01/28/2024, 02/04/2024, 02/11/2024, 02/18/2024   CBC and differential SEE COMMENTS 12/03/2023, 12/10/2023, 12/17/2023, 12/24/2023, 12/31/2023, 01/07/2024, 01/14/2024, 01/21/2024, 01/28/2024, 02/04/2024, 02/11/2024, 02/18/2024

## 2024-01-02 ENCOUNTER — Ambulatory Visit: Payer: Vision Other Private Insurance | Admitting: Optometry

## 2024-01-08 ENCOUNTER — Ambulatory Visit: Payer: Vision Other Private Insurance | Admitting: Optometry

## 2024-03-14 ENCOUNTER — Telehealth: Payer: Self-pay | Admitting: Internal Medicine

## 2024-03-14 ENCOUNTER — Other Ambulatory Visit: Payer: Self-pay | Admitting: Internal Medicine

## 2024-03-14 DIAGNOSIS — Z6841 Body Mass Index (BMI) 40.0 and over, adult: Secondary | ICD-10-CM

## 2024-03-14 NOTE — Telephone Encounter (Deleted)
 Heather Callahan has had successful weight loss with Zepbound . Currently the patient's dose is 10mg  weekly, and the patient has continued efforts of diet and exercise in addition to the medication Her current weight is * lb (*)Her baseline weight was 306 lb (BMI 46.6)

## 2024-03-14 NOTE — Telephone Encounter (Signed)
 Last in-office visit with [facility] MD/DO: 2/26/2025Last in-office visit with [facility] APP: Visit date not foundLast telemedicine visit: Visit date not foundNext visit: Future Appointments Date Time Provider Department Center 08/29/2024  1:00 PM Pruett, Stephane Jansky, MD [facility] None  Requested Prescriptions Pending Prescriptions Disp Refills  ZEPBOUND  10 MG/0.5ML pen [Pharmacy Med Name: ZEPBOUND  10 MG/0.5 ML PEN]  5   Sig: INJECT 10 MG INTO THE SKIN ONE TIME PER WEEK Recent Lab results:  Lab results: 09/24/241621 Sodium 139 Potassium 4.6 Chloride 103 CO2 28 UN 9 Creatinine 0.80 Glucose 92 Calcium 9.9 No results found for: MALBR Hemoglobin A1C (%) Date Value 09/03/2023 5.4   Lab results: 04/01/250000 09/24/241621 Cholesterol 221  221 231* HDL 45 37* LDL Calculated 852 168* Triglycerides 150  150 129 Chol/HDL Ratio  --  6.2 No results for input(s): TSH in the last 8760 hours. No results for input(s): AMPU, BEU, OPSU, OXYU, THCU, BZDU, COPS, CTHC, UCRNC in the last 8760 hours.Pending/Orders Labs:Lab Frequency Next Occurrence CBC and differential Once 11/27/2023 Iron Once 11/27/2023 TIBC Once 11/27/2023 Ferritin Once 11/27/2023 Transferrin Once 11/27/2023 Vitamin B12 Once 11/27/2023 Folate Once 11/27/2023 Hemoglobin A1c SEE COMMENTS 03/17/2024, 03/24/2024, 03/31/2024, 04/07/2024, 04/14/2024, 04/21/2024, 04/28/2024, 05/05/2024, 05/12/2024, 05/19/2024, 05/26/2024, 06/02/2024 CBC and differential SEE COMMENTS 03/17/2024, 03/24/2024, 03/31/2024, 04/07/2024, 04/14/2024, 04/21/2024, 04/28/2024, 05/05/2024, 05/12/2024, 05/19/2024, 05/26/2024, 06/02/2024

## 2024-03-17 NOTE — Telephone Encounter (Signed)
 11/20/2023 ONCBCN [facility] PRIOR AUTH  Prior Authorization Needed Yes  Treatment/Medication Name Zepbound  10MG /0.5ML pen-injectors  Authorization Type Medication  Diagnosis Code e66.9  Service Location Other  Prior Authorization Status Authorization approved  Authorization Start Date 11/21/2023  Authorization End Date 11/20/2024  Insurance Company Name [employer]  Policy Number [id number]  Comments key: [id number]

## 2024-03-17 NOTE — Telephone Encounter (Signed)
 Last office visit: 2/26/2025Last telehome visit: Visit date not foundPatients upcoming appointments:Future Appointments Date Time Provider Department Center 08/29/2024  1:00 PM Pruett, Stephane Jansky, MD [facility] None BP Readings from Last 3 Encounters: 08/29/23 110/68 04/09/23 131/78 03/27/23 128/74  Recent Lab results:GENERAL CHEMISTRY Recent Labs   09/24/241621 NA 139 K 4.6 CL 103 CO2 28 GAP 8 UN 9 CREAT 0.80 GLU 92 CA 9.9  LIPID PROFILE Recent Labs   04/01/250000 09/24/241621 CHOL 221  221 231* TRIG 150  150 129 HDL 45 37* LDLC 147 168*  LIVER PROFILE Recent Labs   09/24/241621 ALT 35 AST 39* ALK 100 TB 0.6  DIABETES THYROID  Recent Labs   03/03/251241 09/24/241621 HA1C 5.4 6.3*  No value within the past 365 days  Pending/Orders Labs:Lab Frequency Next Occurrence CBC and differential Once 11/27/2023 Iron Once 11/27/2023 TIBC Once 11/27/2023 Ferritin Once 11/27/2023 Transferrin Once 11/27/2023 Vitamin B12 Once 11/27/2023 Folate Once 11/27/2023 Hemoglobin A1c SEE COMMENTS 03/17/2024, 03/24/2024, 03/31/2024, 04/07/2024, 04/14/2024, 04/21/2024, 04/28/2024, 05/05/2024, 05/12/2024, 05/19/2024, 05/26/2024, 06/02/2024 CBC and differential SEE COMMENTS 03/17/2024, 03/24/2024, 03/31/2024, 04/07/2024, 04/14/2024, 04/21/2024, 04/28/2024, 05/05/2024, 05/12/2024, 05/19/2024, 05/26/2024, 06/02/2024

## 2024-03-31 ENCOUNTER — Encounter: Payer: Self-pay | Admitting: Internal Medicine

## 2024-03-31 NOTE — Telephone Encounter (Signed)
 error

## 2024-04-03 NOTE — Telephone Encounter (Signed)
 Spoke with pt, pt confirmed she was able to pick up Zepbound  Rx; reports she self-administers Zepbound  every Friday, as prescribed.No further questions/concerns at this time.

## 2024-04-24 ENCOUNTER — Other Ambulatory Visit: Payer: Self-pay

## 2024-04-24 MED ORDER — COVID-19 MRNA VACCINE (MODERNA) 50 MCG/0.5ML IM SYRINGE (>/= 12Y) *I*
0.5000 mL | PREFILLED_SYRINGE | INTRAMUSCULAR | 0 refills | Status: AC
Start: 2024-04-14 — End: ?
  Filled 2024-04-14: qty 0.5, 1d supply, fill #0

## 2024-04-25 ENCOUNTER — Other Ambulatory Visit: Payer: Self-pay

## 2024-04-25 MED ORDER — INFLUENZA VAC TISS-CULT SUBUNT 0.5 ML SUSP (FLUCELVAX)*I*
0.5000 mL | PREFILLED_SYRINGE | INTRAMUSCULAR | 0 refills | Status: AC
Start: 2024-04-14 — End: ?
  Filled 2024-04-14: qty 0.5, 1d supply, fill #0

## 2024-07-16 ENCOUNTER — Encounter: Payer: Self-pay | Admitting: Internal Medicine

## 2024-07-18 NOTE — Telephone Encounter (Signed)
 Insurance information entered .

## 2024-07-31 ENCOUNTER — Other Ambulatory Visit: Payer: Self-pay | Admitting: Internal Medicine

## 2024-07-31 DIAGNOSIS — Z6841 Body Mass Index (BMI) 40.0 and over, adult: Secondary | ICD-10-CM

## 2024-07-31 MED ORDER — ZEPBOUND 10 MG/0.5ML SC SOAJ: 10.0000 mg | SUBCUTANEOUS | 2 refills | Status: AC

## 2024-08-29 ENCOUNTER — Encounter: Payer: No Typology Code available for payment source | Admitting: Internal Medicine
